# Patient Record
Sex: Male | Born: 1990 | Race: White | Hispanic: No | Marital: Single | Smoking: Former smoker
Health system: Southern US, Community
[De-identification: ages and names within clinical notes are randomized; demographics above are authoritative.]

## PROBLEM LIST (undated history)

## (undated) DIAGNOSIS — K219 Gastro-esophageal reflux disease without esophagitis: Secondary | ICD-10-CM

## (undated) DIAGNOSIS — M26629 Arthralgia of temporomandibular joint, unspecified side: Secondary | ICD-10-CM

## (undated) DIAGNOSIS — T7840XA Allergy, unspecified, initial encounter: Secondary | ICD-10-CM

## (undated) HISTORY — DX: Gastro-esophageal reflux disease without esophagitis: K21.9

## (undated) HISTORY — PX: NO PAST SURGERIES: SHX2092

## (undated) HISTORY — DX: Allergy, unspecified, initial encounter: T78.40XA

## (undated) HISTORY — DX: Arthralgia of temporomandibular joint, unspecified side: M26.629

---

## 2013-10-27 ENCOUNTER — Ambulatory Visit (INDEPENDENT_AMBULATORY_CARE_PROVIDER_SITE_OTHER): Payer: BC Managed Care – PPO | Admitting: Family Medicine

## 2013-10-27 VITALS — BP 110/70 | HR 80 | Temp 98.0°F | Resp 16 | Ht 72.5 in | Wt 163.0 lb

## 2013-10-27 DIAGNOSIS — Z0000 Encounter for general adult medical examination without abnormal findings: Secondary | ICD-10-CM

## 2013-10-27 NOTE — Progress Notes (Signed)
Patient ID: Marc Decker MRN: [id number], DOB: 05/19/1991 23 y.o. Date of Encounter: 10/27/2013, 8:31 PM  Primary Physician: No primary provider on file.  Chief Complaint: Physical (CPE)  HPI: 23 y.o. y/o male with history noted below here for CPE.  Doing well. No issues/complaints.  Review of Systems: Consitutional: No fever, chills, fatigue, night sweats, lymphadenopathy, or weight changes. Eyes: No visual changes, eye redness, or discharge. ENT/Mouth: Ears: No otalgia, tinnitus, hearing loss, discharge. Nose: No congestion, rhinorrhea, sinus pain, or epistaxis. Throat: No sore throat, post nasal drip, or teeth pain. Cardiovascular: No CP, palpitations, diaphoresis, DOE, edema, orthopnea, PND. Respiratory: No cough, hemoptysis, SOB, or wheezing. Gastrointestinal: No anorexia, dysphagia, reflux, pain, nausea, vomiting, hematemesis, diarrhea, constipation, BRBPR, or melena. Genitourinary: No dysuria, frequency, urgency, hematuria, incontinence, nocturia, decreased urinary stream, discharge, impotence, or testicular pain/masses. Musculoskeletal: No decreased ROM, myalgias, stiffness, joint swelling, or weakness. Skin: No rash, erythema, lesion changes, pain, warmth, jaundice, or pruritis. Neurological: No headache, dizziness, syncope, seizures, tremors, memory loss, coordination problems, or paresthesias. Psychological: No anxiety, depression, hallucinations, SI/HI. Endocrine: No fatigue, polydipsia, polyphagia, polyuria, or known diabetes. All other systems were reviewed and are otherwise negative.  No past medical history on file.   No past surgical history on file.  Home Meds:  Prior to Admission medications   Not on File    Allergies:  Allergies  Allergen Reactions  . Zithromax [Azithromycin]     History   Social History  . Marital Status: Single    Spouse Name: N/A    Number of Children: N/A  . Years of Education: N/A   Occupational History  . Not on file.    Social History Main Topics  . Smoking status: Light Tobacco Smoker  . Smokeless tobacco: Not on file  . Alcohol Use: Not on file  . Drug Use: Not on file  . Sexual Activity: Not on file   Other Topics Concern  . Not on file   Social History Narrative  . No narrative on file    No family history on file.  Physical Exam: Blood pressure 110/70, pulse 80, temperature 98 F (36.7 C), resp. rate 16, height 6' 0.5" (1.842 m), weight 163 lb (73.936 kg), SpO2 98.00%.  General: Well developed, well nourished, in no acute distress. HEENT: Normocephalic, atraumatic. Conjunctiva pink, sclera non-icteric. Pupils 2 mm constricting to 1 mm, round, regular, and equally reactive to light and accomodation. EOMI. Internal auditory canal clear. TMs with good cone of light and without pathology. Nasal mucosa pink. Nares are without discharge. No sinus tenderness. Oral mucosa pink. Dentition good. Pharynx without exudate.   Neck: Supple. Trachea midline. No thyromegaly. Full ROM. No lymphadenopathy. Lungs: Clear to auscultation bilaterally without wheezes, rales, or rhonchi. Breathing is of normal effort and unlabored. Cardiovascular: RRR with S1 S2. No murmurs, rubs, or gallops appreciated. Distal pulses 2+ symmetrically. No carotid or abdominal bruits Abdomen: Soft, non-tender, non-distended with normoactive bowel sounds. No hepatosplenomegaly or masses. No rebound/guarding. No CVA tenderness. Without hernias.   Genitourinary:  circumcised male. No penile lesions. Testes descended bilaterally, and smooth without tenderness or masses.  Musculoskeletal: Full range of motion and 5/5 strength throughout. Without swelling, atrophy, tenderness, crepitus, or warmth. Extremities without clubbing, cyanosis, or edema. Calves supple. Skin: Warm and moist without erythema, ecchymosis, wounds, or rash. Neuro: A+Ox3. CN II-XII grossly intact. Moves all extremities spontaneously. Full sensation throughout. Normal gait.  DTR 2+ throughout upper and lower extremities. Finger to nose intact. Psych:  Responds  to questions appropriately with a normal affect.     Assessment/Plan:  23 y.o. y/o  male here for CPE which is normal -  Signed, Elvina Sidle, MD 10/27/2013 8:31 PM

## 2013-10-27 NOTE — Patient Instructions (Signed)

## 2014-01-09 ENCOUNTER — Ambulatory Visit (INDEPENDENT_AMBULATORY_CARE_PROVIDER_SITE_OTHER): Payer: BC Managed Care – PPO | Admitting: Internal Medicine

## 2014-01-09 VITALS — BP 110/68 | HR 78 | Temp 98.1°F | Resp 16 | Ht 71.5 in | Wt 157.6 lb

## 2014-01-09 DIAGNOSIS — R059 Cough, unspecified: Secondary | ICD-10-CM

## 2014-01-09 DIAGNOSIS — R05 Cough: Secondary | ICD-10-CM

## 2014-01-09 MED ORDER — HYDROCODONE-HOMATROPINE 5-1.5 MG/5ML PO SYRP: 5.0000 mL | ORAL_SOLUTION | Freq: Four times a day (QID) | ORAL | Status: DC | PRN

## 2014-01-09 NOTE — Progress Notes (Signed)
  This chart was scribed for Ellamae Siaobert Doolittle, MD by Joaquin MusicKristina Sanchez-Matthews, ED Scribe. This patient was seen in room Room/bed 9 and the patient's care was started at 10:58 AM. Subjective:    Patient ID: Marc Decker, male    DOB: 08/29/1990, 23 y.o.   MRN: [id number] Chief Complaint  Patient presents with  . Sore Throat    x 4 days   . Nasal Congestion  . Headache   HPI Marc Decker is a 23 y.o. male who [facility] complaining of ongoing productive cough with associated CP, sore throat, nasal congestion, and HA that began 4 days ago. He states when coughing, he has bilateral CP, near his ribs. Pt states he began taking OTC DayQuil but states this "agitated his stomach". He has been having trouble breathing when lying down lately. Reports having seasonal allergies around this time of year but states his current sx are worse than normal. Denies recent fevers, SOB, and hx of asthma.  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  No current outpatient prescriptions on file.  Review of Systems  Constitutional: Negative for fever and chills.  HENT: Positive for congestion, sinus pressure and sore throat. Negative for trouble swallowing.   Respiratory: Positive for cough. Negative for shortness of breath.   Cardiovascular: Positive for chest pain (when coughing).  Neurological: Positive for headaches.   Objective:   Physical Exam  Nursing note and vitals reviewed. Constitutional: He is oriented to person, place, and time. He appears well-developed and well-nourished. No distress.  HENT:  Head: Normocephalic and atraumatic.  Right Ear: External ear normal.  Left Ear: External ear normal.  Nose: Nose normal.  Mouth/Throat: Oropharynx is clear and moist.  Eyes: Conjunctivae and EOM are normal.  Neck: Neck supple. No thyromegaly present.  Cardiovascular: Normal rate, regular rhythm and normal heart sounds.     No murmur heard. Pulmonary/Chest: Effort normal and breath sounds normal. No respiratory distress. He has no wheezes. He has no rales. He exhibits no tenderness.  Lungs are clear.  Musculoskeletal: Normal range of motion.  Lymphadenopathy:    He has no cervical adenopathy.  Neurological: He is alert and oriented to person, place, and time.  Skin: Skin is warm and dry.  Psychiatric: He has a normal mood and affect. His behavior is normal.   Triage Vitals:BP 110/68  Pulse 78  Temp(Src) 98.1 F (36.7 C) (Oral)  Resp 16  Ht 5' 11.5" (1.816 m)  Wt 157 lb 9.6 oz (71.487 kg)  BMI 21.68 kg/m2  SpO2 97% Assessment & Plan:    I have completed the patient encounter in its entirety as documented by the scribe, with editing by me where necessary. Robert P. Merla Richesoolittle, M.D. Cough  viral infection suspected  Meds ordered this encounter  Medications  . HYDROcodone-homatropine (HYCODAN) 5-1.5 MG/5ML syrup    Sig: Take 5 mLs by mouth every 6 (six) hours as needed for cough.    Dispense:  120 mL    Refill:  0   Followup if not well in weight

## 2014-01-13 ENCOUNTER — Telehealth: Payer: Self-pay

## 2014-01-13 NOTE — Telephone Encounter (Signed)
Pt seen Dr Merla Richesoolittle 01/09/14 and states his symptoms are worse and would like to know if Dr Merla Richesoolittle could call in something else for him. He may be reached at [phone]. Thank you

## 2014-01-14 ENCOUNTER — Ambulatory Visit (INDEPENDENT_AMBULATORY_CARE_PROVIDER_SITE_OTHER): Payer: BC Managed Care – PPO | Admitting: Emergency Medicine

## 2014-01-14 VITALS — BP 104/70 | HR 94 | Temp 98.1°F | Resp 18 | Ht 71.0 in | Wt 155.6 lb

## 2014-01-14 DIAGNOSIS — J018 Other acute sinusitis: Secondary | ICD-10-CM

## 2014-01-14 DIAGNOSIS — J209 Acute bronchitis, unspecified: Secondary | ICD-10-CM

## 2014-01-14 MED ORDER — AMOXICILLIN-POT CLAVULANATE 875-125 MG PO TABS: 1.0000 | ORAL_TABLET | Freq: Two times a day (BID) | ORAL | Status: DC

## 2014-01-14 MED ORDER — PROMETHAZINE-CODEINE 6.25-10 MG/5ML PO SYRP: 5.0000 mL | ORAL_SOLUTION | Freq: Four times a day (QID) | ORAL | Status: DC | PRN

## 2014-01-14 MED ORDER — PSEUDOEPHEDRINE-GUAIFENESIN ER 60-600 MG PO TB12: 1.0000 | ORAL_TABLET | Freq: Two times a day (BID) | ORAL | Status: DC

## 2014-01-14 NOTE — Patient Instructions (Signed)
Sinusitis Sinusitis is redness, soreness, and swelling (inflammation) of the paranasal sinuses. Paranasal sinuses are air pockets within the bones of your face (beneath the eyes, the middle of the forehead, or above the eyes). In healthy paranasal sinuses, mucus is able to drain out, and air is able to circulate through them by way of your nose. However, when your paranasal sinuses are inflamed, mucus and air can become trapped. This can allow bacteria and other germs to grow and cause infection. Sinusitis can develop quickly and last only a short time (acute) or continue over a long period (chronic). Sinusitis that lasts for more than 12 weeks is considered chronic.  CAUSES  Causes of sinusitis include:  Allergies.  Structural abnormalities, such as displacement of the cartilage that separates your nostrils (deviated septum), which can decrease the air flow through your nose and sinuses and affect sinus drainage.  Functional abnormalities, such as when the small hairs (cilia) that line your sinuses and help remove mucus do not work properly or are not present. SYMPTOMS  Symptoms of acute and chronic sinusitis are the same. The primary symptoms are pain and pressure around the affected sinuses. Other symptoms include:  Upper toothache.  Earache.  Headache.  Bad breath.  Decreased sense of smell and taste.  A cough, which worsens when you are lying flat.  Fatigue.  Fever.  Thick drainage from your nose, which often is green and may contain pus (purulent).  Swelling and warmth over the affected sinuses. DIAGNOSIS  Your caregiver will perform a physical exam. During the exam, your caregiver may:  Look in your nose for signs of abnormal growths in your nostrils (nasal polyps).  Tap over the affected sinus to check for signs of infection.  View the inside of your sinuses (endoscopy) with a special imaging device with a light attached (endoscope), which is inserted into your  sinuses. If your caregiver suspects that you have chronic sinusitis, one or more of the following tests may be recommended:  Allergy tests.  Nasal culture--A sample of mucus is taken from your nose and sent to a lab and screened for bacteria.  Nasal cytology--A sample of mucus is taken from your nose and examined by your caregiver to determine if your sinusitis is related to an allergy. TREATMENT  Most cases of acute sinusitis are related to a viral infection and will resolve on their own within 10 days. Sometimes medicines are prescribed to help relieve symptoms (pain medicine, decongestants, nasal steroid sprays, or saline sprays).  However, for sinusitis related to a bacterial infection, your caregiver will prescribe antibiotic medicines. These are medicines that will help kill the bacteria causing the infection.  Rarely, sinusitis is caused by a fungal infection. In theses cases, your caregiver will prescribe antifungal medicine. For some cases of chronic sinusitis, surgery is needed. Generally, these are cases in which sinusitis recurs more than 3 times per year, despite other treatments. HOME CARE INSTRUCTIONS   Drink plenty of water. Water helps thin the mucus so your sinuses can drain more easily.  Use a humidifier.  Inhale steam 3 to 4 times a day (for example, sit in the bathroom with the shower running).  Apply a warm, moist washcloth to your face 3 to 4 times a day, or as directed by your caregiver.  Use saline nasal sprays to help moisten and clean your sinuses.  Take over-the-counter or prescription medicines for pain, discomfort, or fever only as directed by your caregiver. SEEK IMMEDIATE MEDICAL CARE IF:    You have increasing pain or severe headaches.  You have nausea, vomiting, or drowsiness.  You have swelling around your face.  You have vision problems.  You have a stiff neck.  You have difficulty breathing. MAKE SURE YOU:   Understand these  instructions.  Will watch your condition.  Will get help right away if you are not doing well or get worse. Document Released: 07/17/2005 Document Revised: 10/09/2011 Document Reviewed: 08/01/2011 ExitCare Patient Information 2015 [employer]. This information is not intended to replace advice given to you by your health care provider. Make sure you discuss any questions you have with your health care provider.  

## 2014-01-14 NOTE — Telephone Encounter (Signed)
Cough  viral infection suspected  Meds ordered this encounter   Medications   .  HYDROcodone-homatropine (HYCODAN) 5-1.5 MG/5ML syrup     Sig: Take 5 mLs by mouth every 6 (six) hours as needed for cough.     Dispense: 120 mL     Refill: 0   Followup if not well/  Called patient, he is here now waiting to be seen.

## 2014-01-14 NOTE — Addendum Note (Signed)
Addended by: Carmelina DaneANDERSON, JEFFERY S on: 01/14/2014 05:35 PM   Modules accepted: Orders

## 2014-01-14 NOTE — Telephone Encounter (Signed)
Call for sxtoms

## 2014-01-14 NOTE — Progress Notes (Signed)
[facility] [address], [place] [address] [phone]  Date:  01/14/2014   Name:  Marc HauGriffin D Russman   DOB:  05/30/1991   MRN:  [id number]  PCP:  No PCP Per Patient    Chief Complaint: Shortness of Breath and Cough   History of Present Illness:  Marc Decker is a 23 y.o. very pleasant male patient who presents with the following:  Ill for over a week with purulent nasal drainage and a cough productive purulent sputum. No wheezing.  Moderate post nasal drainage.  Fatigued.  No fever or chills.  Has an increasing shortness of breath.  No nausea or vomiting.  No stool change or rash. Was taking dayquil but it did not agree with him. Denies other complaint or health concern today.   There are no active problems to display for this patient.   History reviewed. No pertinent past medical history.  History reviewed. No pertinent past surgical history.  History  Substance Use Topics  . Smoking status: Light Tobacco Smoker  . Smokeless tobacco: Not on file  . Alcohol Use: Not on file    History reviewed. No pertinent family history.  Allergies  Allergen Reactions  . Zithromax [Azithromycin]     Medication list has been reviewed and updated.  Current Outpatient Prescriptions on File Prior to Visit  Medication Sig Dispense Refill  . HYDROcodone-homatropine (HYCODAN) 5-1.5 MG/5ML syrup Take 5 mLs by mouth every 6 (six) hours as needed for cough.  120 mL  0   No current facility-administered medications on file prior to visit.    Review of Systems:  As per HPI, otherwise negative.    Physical Examination: Filed Vitals:   01/14/14 1523  BP: 104/70  Pulse: 94  Temp: 98.1 F (36.7 C)  Resp: 18   Filed Vitals:   01/14/14 1523  Height: 5\' 11"  (1.803 m)  Weight: 155 lb 9.6 oz (70.58 kg)   Body mass index is 21.71 kg/(m^2). Ideal Body Weight: Weight in (lb) to have BMI = 25: 178.9  GEN: WDWN, NAD, Non-toxic, A & O x 3 HEENT:  Atraumatic, Normocephalic. Neck supple. No masses, No LAD. Ears and Nose: No external deformity. CV: RRR, No M/G/R. No JVD. No thrill. No extra heart sounds. PULM: CTA B, no wheezes, crackles, rhonchi. No retractions. No resp. distress. No accessory muscle use. ABD: S, NT, ND, +BS. No rebound. No HSM. EXTR: No c/c/e NEURO Normal gait.  PSYCH: Normally interactive. Conversant. Not depressed or anxious appearing.  Calm demeanor.    Assessment and Plan: Sinusitis  Bronchitis augmentin mucinex  Signed,  Phillips OdorJeffery Heily Carlucci, MD

## 2014-01-21 ENCOUNTER — Telehealth: Payer: Self-pay

## 2014-01-21 NOTE — Telephone Encounter (Signed)
Given that he has completed all but the last 3 doses, and his symptoms are resolved, I recommend that he stop the antibiotic and not replace it.  If his symptoms recur, he should contact us.  Augmentin added to his allergy list as an intolerance.

## 2014-01-21 NOTE — Telephone Encounter (Signed)
Pt has been having nausea, some vomiting and diarrhea since starting the Augmentin.  Pt took medication today and has 3 doses left. He states his sinus infection has cleared up well. He has been taking Musinex.   Should he be changed to a different abx?

## 2014-01-21 NOTE — Telephone Encounter (Signed)
PATIENT WAS SEEN FOR SINUS INFECTION AND BRONCHITIS, HE STATES THE MEDICATION PRESCRIBED CAUSES SEVERE ABDOMINAL PAIN AND DIARRHEA. HE SAYS HE CALLED THE PHARMACIST TO SEE IF HE COULD TAKE IMODIUM AND THEY TOLD HIM THAT WAS NOT A GOOD IDEA. PATIENT WANTS TO KNOW IF HE CAN DISCONTINUE THE USE OF THE MEDICATION WITH HIM ONLY HAVE TWO PILLS LEFT OR IF THERE IS SOMETHING ELSE THAT CAN BE PRESCRIBED. HE HAS TO BE AT WORK TODAY, AND DOES NOT WANT TO FEEL MISERABLE ALL DAY IF HE TAKES THE MEDICATION. PLEASE CONTACT PATIENT AND ADVISE.

## 2014-01-22 NOTE — Telephone Encounter (Signed)
Spoke to pt, he is aware 

## 2014-06-01 ENCOUNTER — Ambulatory Visit (INDEPENDENT_AMBULATORY_CARE_PROVIDER_SITE_OTHER): Payer: BC Managed Care – PPO | Admitting: Emergency Medicine

## 2014-06-01 VITALS — BP 110/70 | HR 74 | Temp 98.9°F | Resp 16 | Ht 71.75 in | Wt 155.0 lb

## 2014-06-01 DIAGNOSIS — J018 Other acute sinusitis: Secondary | ICD-10-CM

## 2014-06-01 DIAGNOSIS — J209 Acute bronchitis, unspecified: Secondary | ICD-10-CM

## 2014-06-01 MED ORDER — AMOXICILLIN-POT CLAVULANATE 875-125 MG PO TABS: 1.0000 | ORAL_TABLET | Freq: Two times a day (BID) | ORAL | Status: DC

## 2014-06-01 MED ORDER — PSEUDOEPHEDRINE-GUAIFENESIN ER 60-600 MG PO TB12: 1.0000 | ORAL_TABLET | Freq: Two times a day (BID) | ORAL | Status: DC

## 2014-06-01 MED ORDER — PROMETHAZINE-CODEINE 6.25-10 MG/5ML PO SYRP: 5.0000 mL | ORAL_SOLUTION | Freq: Four times a day (QID) | ORAL | Status: DC | PRN

## 2014-06-01 NOTE — Progress Notes (Signed)
[facility] [address], [place] [address] [phone]  Date:  06/01/2014   Name:  Marc HauGriffin D Turman   DOB:  09/25/1990   MRN:  [id number]  PCP:  No PCP Per Patient    Chief Complaint: Sore Throat; Generalized Body Aches; and Cough   History of Present Illness:  Marc Decker is a 23 y.o. very pleasant male patient who presents with the following:  Ill since the end of last week.  Has nasal congestion and post nasal drainage.  Purulent in character Has cough productive of purulent sputum.  No wheezing or shortness of breath. Sore throat Malaise and myalgias.  Fatigue No fever or chills History of prior sinusitis No nausea or vomiting.  No diarrhea or rash No improvement with over the counter medications or other home remedies. Denies other complaint or health concern today.   There are no active problems to display for this patient.   History reviewed. No pertinent past medical history.  History reviewed. No pertinent past surgical history.  History  Substance Use Topics  . Smoking status: Light Tobacco Smoker  . Smokeless tobacco: Not on file  . Alcohol Use: Not on file    History reviewed. No pertinent family history.  Allergies  Allergen Reactions  . Augmentin [Amoxicillin-Pot Clavulanate] Diarrhea and Nausea And Vomiting  . Zithromax [Azithromycin]     Medication list has been reviewed and updated.  Current Outpatient Prescriptions on File Prior to Visit  Medication Sig Dispense Refill  . HYDROcodone-homatropine (HYCODAN) 5-1.5 MG/5ML syrup Take 5 mLs by mouth every 6 (six) hours as needed for cough. 120 mL 0  . promethazine-codeine (PHENERGAN WITH CODEINE) 6.25-10 MG/5ML syrup Take 5-10 mLs by mouth every 6 (six) hours as needed. 120 mL 0  . pseudoephedrine-guaifenesin (MUCINEX D) 60-600 MG per tablet Take 1 tablet by mouth every 12 (twelve) hours. 18 tablet 0   No current facility-administered medications on file prior to  visit.    Review of Systems:  As per HPI, otherwise negative.    Physical Examination: Filed Vitals:   06/01/14 1155  BP: 110/70  Pulse: 74  Temp: 98.9 F (37.2 C)  Resp: 16   Filed Vitals:   06/01/14 1155  Height: 5' 11.75" (1.822 m)  Weight: 155 lb (70.308 kg)   Body mass index is 21.18 kg/(m^2). Ideal Body Weight: Weight in (lb) to have BMI = 25: 182.7  GEN: WDWN, moderate distrss, Non-toxic, A & O x 3 HEENT: Atraumatic, Normocephalic. Neck supple. No masses, No LAD. Ears and Nose: No external deformity. CV: RRR, No M/G/R. No JVD. No thrill. No extra heart sounds. PULM: CTA B, no wheezes, crackles, rhonchi. No retractions. No resp. distress. No accessory muscle use. ABD: S, NT, ND, +BS. No rebound. No HSM. EXTR: No c/c/e NEURO Normal gait.  PSYCH: Normally interactive. Conversant. Not depressed or anxious appearing.  Calm demeanor.    Assessment and Plan: Sinusitis Bronchitis augmentin mucinex d Phen c cod  Signed,  Phillips OdorJeffery Jonny Longino, MD

## 2014-06-01 NOTE — Patient Instructions (Signed)

## 2014-07-27 ENCOUNTER — Ambulatory Visit (INDEPENDENT_AMBULATORY_CARE_PROVIDER_SITE_OTHER): Payer: BC Managed Care – PPO | Admitting: Family Medicine

## 2014-07-27 VITALS — BP 124/80 | HR 77 | Temp 98.2°F | Resp 18 | Ht 73.0 in | Wt 161.8 lb

## 2014-07-27 DIAGNOSIS — R059 Cough, unspecified: Secondary | ICD-10-CM

## 2014-07-27 DIAGNOSIS — R6889 Other general symptoms and signs: Secondary | ICD-10-CM

## 2014-07-27 DIAGNOSIS — R05 Cough: Secondary | ICD-10-CM

## 2014-07-27 LAB — POCT INFLUENZA A/B
Influenza A, POC: NEGATIVE
Influenza B, POC: NEGATIVE

## 2014-07-27 MED ORDER — OSELTAMIVIR PHOSPHATE 75 MG PO CAPS: 75.0000 mg | ORAL_CAPSULE | Freq: Two times a day (BID) | ORAL | Status: DC

## 2014-07-27 MED ORDER — HYDROCODONE-HOMATROPINE 5-1.5 MG/5ML PO SYRP: 5.0000 mL | ORAL_SOLUTION | Freq: Four times a day (QID) | ORAL | Status: DC | PRN

## 2014-07-27 NOTE — Progress Notes (Signed)
Chief Complaint:  Chief Complaint  Patient presents with  . Flu-Like Symptoms    x1 day  . Diarrhea  . Generalized Body Aches  . Other    hot and cold spells   . Headache    HPI: Marc Decker is a 23 y.o. male who is here for 1 day hx of flu like sxs, has ahd msk aches, ahs had subjective fevers and chillsm did not get flu cavvine, has tried tylenol , has ahd HA No real sinus tenderenss, + , back pain, and msk and neck pain , PND cough   History reviewed. No pertinent past medical history. History reviewed. No pertinent past surgical history. History   Social History  . Marital Status: Single    Spouse Name: N/A    Number of Children: N/A  . Years of Education: N/A   Social History Main Topics  . Smoking status: Light Tobacco Smoker  . Smokeless tobacco: None  . Alcohol Use: None  . Drug Use: None  . Sexual Activity: None   Other Topics Concern  . None   Social History Narrative   History reviewed. No pertinent family history. Allergies  Allergen Reactions  . Augmentin [Amoxicillin-Pot Clavulanate] Diarrhea and Nausea And Vomiting  . Zithromax [Azithromycin]    Prior to Admission medications   Medication Sig Start Date End Date Taking? Authorizing Provider  amoxicillin-clavulanate (AUGMENTIN) 875-125 MG per tablet Take 1 tablet by mouth 2 (two) times daily. Patient not taking: Reported on 07/27/2014 06/01/14   Carmelina DaneJeffery S Anderson, MD  HYDROcodone-homatropine [facility](HYCODAN) 5-1.5 MG/5ML syrup Take 5 mLs by mouth every 6 (six) hours as needed for cough. Patient not taking: Reported on 07/27/2014 01/09/14   Tonye Pearsonobert P Doolittle, MD  promethazine-codeine [facility](PHENERGAN WITH CODEINE) 6.25-10 MG/5ML syrup Take 5-10 mLs by mouth every 6 (six) hours as needed. Patient not taking: Reported on 07/27/2014 06/01/14   Carmelina DaneJeffery S Anderson, MD  pseudoephedrine-guaifenesin [facility](MUCINEX D) 60-600 MG per tablet Take 1 tablet by mouth every 12 (twelve) hours. Patient not taking: Reported  on 07/27/2014 06/01/14 06/01/15  Carmelina DaneJeffery S Anderson, MD     ROS: The patient denies fevers, chills, night sweats, unintentional weight loss, chest pain, palpitations, wheezing, dyspnea on exertion, nausea, vomiting, abdominal pain, dysuria, hematuria, melena, numbness, weakness, or tingling.   All other systems have been reviewed and were otherwise negative with the exception of those mentioned in the HPI and as above.    PHYSICAL EXAM: Filed Vitals:   07/27/14 0954  BP: 124/80  Pulse: 77  Temp: 98.2 F (36.8 C)  Resp: 18   Filed Vitals:   07/27/14 0954  Height: 6\' 1"  (1.854 m)  Weight: 161 lb 12.8 oz (73.392 kg)   Body mass index is 21.35 kg/(m^2).  General: Alert, no acute distress HEENT:  Normocephalic, atraumatic, oropharynx patent. EOMI, PERRLA Cardiovascular:  Regular rate and rhythm, no rubs murmurs or gallops.  No Carotid bruits, radial pulse intact. No pedal edema.  Respiratory: Clear to auscultation bilaterally.  No wheezes, rales, or rhonchi.  No cyanosis, no use of accessory musculature GI: No organomegaly, abdomen is soft and non-tender, positive bowel sounds.  No masses. Skin: No rashes. Neurologic: Facial musculature symmetric. Psychiatric: Patient is appropriate throughout our interaction. Lymphatic: No cervical lymphadenopathy Musculoskeletal: Gait intact.   LABS: Results for orders placed or performed in visit on 07/27/14  POCT Influenza A/B  Result Value Ref Range   Influenza A, POC Negative    Influenza B,  POC Negative      EKG/XRAY:   [facility]   ASSESSMENT/PLAN: Encounter Diagnoses  Name Primary?  . Cough Yes  . Fever, unspecified fever cause    Sxs treamtemnt, if worse will let me know  Gross sideeffects, risk and benefits, and alternatives of medications d/w patient. Patient is aware that all medications have potential sideeffects and we are unable to predict every sideeffect or drug-drug interaction that  may occur.  LE, THAO PHUONG, DO 07/27/2014 11:12 AM

## 2014-07-27 NOTE — Patient Instructions (Signed)
Delsyn otc for cough and to dry up your mucus drainage Take otc nasacort for inflammation of nose   Influenza Influenza ("the flu") is a viral infection of the respiratory tract. It occurs more often in winter months because people spend more time in close contact with one another. Influenza can make you feel very sick. Influenza easily spreads from person to person (contagious). CAUSES  Influenza is caused by a virus that infects the respiratory tract. You can catch the virus by breathing in droplets from an infected person's cough or sneeze. You can also catch the virus by touching something that was recently contaminated with the virus and then touching your mouth, nose, or eyes. RISKS AND COMPLICATIONS You may be at risk for a more severe case of influenza if you smoke cigarettes, have diabetes, have chronic heart disease (such as heart failure) or lung disease (such as asthma), or if you have a weakened immune system. Elderly people and pregnant women are also at risk for more serious infections. The most common problem of influenza is a lung infection (pneumonia). Sometimes, this problem can require emergency medical care and may be life threatening. SIGNS AND SYMPTOMS  Symptoms typically last 4 to 10 days and may include:  Fever.  Chills.  Headache, body aches, and muscle aches.  Sore throat.  Chest discomfort and cough.  Poor appetite.  Weakness or feeling tired.  Dizziness.  Nausea or vomiting. DIAGNOSIS  Diagnosis of influenza is often made based on your history and a physical exam. A nose or throat swab test can be done to confirm the diagnosis. TREATMENT  In mild cases, influenza goes away on its own. Treatment is directed at relieving symptoms. For more severe cases, your health care provider may prescribe antiviral medicines to shorten the sickness. Antibiotic medicines are not effective because the infection is caused by a virus, not by bacteria. HOME CARE  INSTRUCTIONS  Take medicines only as directed by your health care provider.  Use a cool mist humidifier to make breathing easier.  Get plenty of rest until your temperature returns to normal. This usually takes 3 to 4 days.  Drink enough fluid to keep your urine clear or pale yellow.  Cover yourmouth and nosewhen coughing or sneezing,and wash your handswellto prevent thevirusfrom spreading.  Stay homefromwork orschool untilthe fever is gonefor at least 451full day. PREVENTION  An annual influenza vaccination (flu shot) is the best way to avoid getting influenza. An annual flu shot is now routinely recommended for all adults in the [place] SEEK MEDICAL CARE IF:  You experiencechest pain, yourcough worsens,or you producemore mucus.  Youhave nausea,vomiting, ordiarrhea.  Your fever returns or gets worse. SEEK IMMEDIATE MEDICAL CARE IF:  You havetrouble breathing, you become short of breath,or your skin ornails becomebluish.  You have severe painor stiffnessin the neck.  You develop a sudden headache, or pain in the face or ear.  You have nausea or vomiting that you cannot control. MAKE SURE YOU:   Understand these instructions.  Will watch your condition.  Will get help right away if you are not doing well or get worse. Document Released: 07/14/2000 Document Revised: 12/01/2013 Document Reviewed: 10/16/2011 [facility] Patient Information 2015 [place], [place]. This information is not intended to replace advice given to you by your health care provider. Make sure you discuss any questions you have with your health care provider.

## 2014-07-28 ENCOUNTER — Telehealth: Payer: Self-pay

## 2014-07-28 NOTE — Telephone Encounter (Signed)
Patient has questions about the tamiflu as it is $150.  Wants to know if we have coupons.  I.  Patient will try to find one on the internet.  s there something else  [phone]

## 2014-08-20 ENCOUNTER — Ambulatory Visit (INDEPENDENT_AMBULATORY_CARE_PROVIDER_SITE_OTHER): Payer: BLUE CROSS/BLUE SHIELD | Admitting: Family Medicine

## 2014-08-20 VITALS — BP 124/76 | HR 106 | Temp 98.4°F | Resp 17 | Ht 72.0 in | Wt 157.0 lb

## 2014-08-20 DIAGNOSIS — R599 Enlarged lymph nodes, unspecified: Secondary | ICD-10-CM

## 2014-08-20 DIAGNOSIS — M25512 Pain in left shoulder: Secondary | ICD-10-CM

## 2014-08-20 DIAGNOSIS — M79622 Pain in left upper arm: Secondary | ICD-10-CM

## 2014-08-20 DIAGNOSIS — R591 Generalized enlarged lymph nodes: Secondary | ICD-10-CM

## 2014-08-20 LAB — POCT CBC
Granulocyte percent: 61.9 %G (ref 37–80)
HEMATOCRIT: 48.8 % (ref 43.5–53.7)
HEMOGLOBIN: 16 g/dL (ref 14.1–18.1)
LYMPH, POC: 1.9 (ref 0.6–3.4)
MCH: 31.3 pg — AB (ref 27–31.2)
MCHC: 32.8 g/dL (ref 31.8–35.4)
MCV: 95.2 fL (ref 80–97)
MID (CBC): 0.5 (ref 0–0.9)
MPV: 8.1 fL (ref 0–99.8)
PLATELET COUNT, POC: 239 10*3/uL (ref 142–424)
POC GRANULOCYTE: 4 (ref 2–6.9)
POC LYMPH PERCENT: 30.3 %L (ref 10–50)
POC MID %: 7.8 %M (ref 0–12)
RBC: 5.13 M/uL (ref 4.69–6.13)
RDW, POC: 12.9 %
WBC: 6.4 10*3/uL (ref 4.6–10.2)

## 2014-08-20 NOTE — Progress Notes (Signed)
Subjective:  This chart was scribed for Meredith StaggersJeffrey Kalman Nylen, MD by Evon Slackerrance Branch, ED Scribe. This Patient was seen in room 14 and the patients care was started at 9:52 AM   Patient ID: Marc Decker, male    DOB: 08/31/1990, 24 y.o.   MRN: [id number]  Chief Complaint  Patient presents with  . swollen lymph node in arm pit    pain arm pit     HPI HPI Comments: Marc Decker is a 24 y.o. male who presents to the [facility] complaining of left axilla pain onset 3 days prior. Pt states he has left sided neck soreness as well. Pt describes the pain as hot and stabbing. Pt states that the pain slightly radiates down into the left rib area. Denies rash, fever, sore throat, CP, SOB, cough, HA, photophobia, sensitivity to light or unexplained weight loss. Pt states that he recently had the flu 2-3 weeks ago. Pt denies any recent cat scratches or bites. Pt denies any arm scratching. Pt states that he has experienced similar pain once when using a new deodorant but states that there was a rash present and those symptoms resolved on there own after switching back to his normal deodorant.    There are no active problems to display for this patient.  No past medical history on file. No past surgical history on file. Allergies  Allergen Reactions  . Augmentin [Amoxicillin-Pot Clavulanate] Diarrhea and Nausea And Vomiting  . Zithromax [Azithromycin]    Prior to Admission medications   Not on File   History   Social History  . Marital Status: Single    Spouse Name: N/A    Number of Children: N/A  . Years of Education: N/A   Occupational History  . Not on file.   Social History Main Topics  . Smoking status: Never Smoker   . Smokeless tobacco: Not on file  . Alcohol Use: Not on file  . Drug Use: Not on file  . Sexual Activity: Not on file   Other Topics Concern  . Not on file   Social History Narrative    Review of Systems  Constitutional: Negative for  fever and unexpected weight change.  HENT: Negative for sore throat.   Eyes: Negative for photophobia.  Respiratory: Negative for cough and shortness of breath.   Cardiovascular: Negative for chest pain.  Musculoskeletal: Positive for neck pain (soreness).       Left axilla pain.   Skin: Negative for rash.  Neurological: Negative for headaches.     Objective:   BP 124/76 mmHg  Pulse 106  Temp(Src) 98.4 F (36.9 C) (Oral)  Resp 17  Ht 6' (1.829 m)  Wt 157 lb (71.215 kg)  BMI 21.29 kg/m2  SpO2 97%   Physical Exam  Constitutional: He is oriented to person, place, and time. He appears well-developed and well-nourished.  HENT:  Head: Normocephalic and atraumatic.  Right Ear: Tympanic membrane, external ear and ear canal normal.  Left Ear: Tympanic membrane, external ear and ear canal normal.  Nose: No rhinorrhea.  Mouth/Throat: Oropharynx is clear and moist and mucous membranes are normal. No oropharyngeal exudate or posterior oropharyngeal erythema.  No tonsillary hypertrophy.   Eyes: Conjunctivae are normal. Pupils are equal, round, and reactive to light.  Neck: Neck supple.  Cardiovascular: Normal rate, regular rhythm, normal heart sounds and intact distal pulses.   No murmur heard. Pulmonary/Chest: Effort normal and breath sounds normal. He has no wheezes. He  has no rhonchi. He has no rales.  Abdominal: Soft. There is no tenderness.  Lymphadenopathy:       Head (left side): No occipital adenopathy present.    He has cervical adenopathy.       Left cervical: Posterior cervical adenopathy present.    He has no axillary adenopathy.  1 small mobile posterior cervical lymph node on left side, no occipital adenopathy noted, Minimal tenderness of submandibular on left side with no apparent swelling or lymph adenopathy. Tenderness in left axilla without apparent rash or lymph adenopathy.   Neurological: He is alert and oriented to person, place, and time.  Skin: Skin is warm and  dry. No rash noted.  No rash on left axilla  Psychiatric: He has a normal mood and affect. His behavior is normal.  Vitals reviewed.  Results for orders placed or performed in visit on 08/20/14  POCT CBC  Result Value Ref Range   WBC 6.4 4.6 - 10.2 K/uL   Lymph, poc 1.9 0.6 - 3.4   POC LYMPH PERCENT 30.3 10 - 50 %L   MID (cbc) 0.5 0 - 0.9   POC MID % 7.8 0 - 12 %M   POC Granulocyte 4.0 2 - 6.9   Granulocyte percent 61.9 37 - 80 %G   RBC 5.13 4.69 - 6.13 M/uL   Hemoglobin 16.0 14.1 - 18.1 g/dL   HCT, POC 16.1 09.6 - 53.7 %   MCV 95.2 80 - 97 fL   MCH, POC 31.3 (A) 27 - 31.2 pg   MCHC 32.8 31.8 - 35.4 g/dL   RDW, POC 04.5 %   Platelet Count, POC 239 142 - 424 K/uL   MPV 8.1 0 - 99.8 fL     Assessment & Plan:   Marc Decker is a 24 y.o. male Axillary pain, left - Plan: POCT CBC, Epstein-Barr virus VCA antibody panel  Lymphadenopathy of head and neck - Plan: POCT CBC, Epstein-Barr virus VCA antibody panel One small lymph node on L neck, but no appreciable nodes in axilla. Will check EBV with recent illness. Recheck in next week or two if not improved, sooner if worse.    No orders of the defined types were placed in this encounter.   Patient Instructions  You should receive a call or letter about your lab results within the next week to 10 days.  No concerning findings on exam of armpit today. Return to the clinic or go to the nearest emergency room if any of your symptoms worsen or new symptoms occur.

## 2014-08-20 NOTE — Patient Instructions (Signed)
You should receive a call or letter about your lab results within the next week to 10 days.  No concerning findings on exam of armpit today. Return to the clinic or go to the nearest emergency room if any of your symptoms worsen or new symptoms occur.

## 2014-08-24 LAB — EPSTEIN-BARR VIRUS VCA ANTIBODY PANEL
EBV EA IgG: <5 U/mL (ref <9.0)
EBV NA IgG: 83.1 U/mL — ABNORMAL HIGH (ref <18.0)
EBV VCA IgG: 455 U/mL — ABNORMAL HIGH (ref <18.0)

## 2014-08-30 ENCOUNTER — Telehealth: Payer: Self-pay | Admitting: Radiology

## 2014-08-30 NOTE — Telephone Encounter (Signed)
Pt calling about labs. Please review.  

## 2014-08-31 ENCOUNTER — Ambulatory Visit (INDEPENDENT_AMBULATORY_CARE_PROVIDER_SITE_OTHER): Payer: BLUE CROSS/BLUE SHIELD | Admitting: Physician Assistant

## 2014-08-31 VITALS — BP 110/72 | HR 72 | Temp 98.3°F | Resp 16 | Ht 72.0 in | Wt 158.0 lb

## 2014-08-31 DIAGNOSIS — M25512 Pain in left shoulder: Secondary | ICD-10-CM

## 2014-08-31 DIAGNOSIS — R591 Generalized enlarged lymph nodes: Secondary | ICD-10-CM

## 2014-08-31 DIAGNOSIS — R599 Enlarged lymph nodes, unspecified: Secondary | ICD-10-CM

## 2014-08-31 DIAGNOSIS — M79622 Pain in left upper arm: Secondary | ICD-10-CM

## 2014-08-31 DIAGNOSIS — B2790 Infectious mononucleosis, unspecified without complication: Secondary | ICD-10-CM

## 2014-08-31 NOTE — Patient Instructions (Signed)
Infectious Mononucleosis  Infectious mononucleosis (mono) is a common germ (viral) infection in children, teenagers, and young adults.   CAUSES   Mono is an infection caused by the Epstein Barr virus. The virus is spread by close personal contact with someone who has the infection. It can be passed by contact with your saliva through things such as kissing or sharing drinking glasses. Sometimes, the infection can be spread from someone who does not appear sick but still spreads the virus (asymptomatic carrier state).   SYMPTOMS   The most common symptoms of Mono are:  · Sore throat.  · Headache.  · Fatigue.  · Muscle aches.  · Swollen glands.  · Fever.  · Poor appetite.  · Enlarged liver or spleen.  The less common symptoms can include:  · Rash.  · Feeling sick to your stomach (nauseous).  · Abdominal pain.  DIAGNOSIS   Mono is diagnosed by a blood test.   TREATMENT   Treatment of mono is usually at home. There is no medicine that cures this virus. Sometimes hospital treatment is needed in severe cases. Steroid medicine sometimes is needed if the swelling in the throat causes breathing or swallowing problems.   HOME CARE INSTRUCTIONS   · Drink enough fluids to keep your urine clear or pale yellow.  · Eat soft foods. Cool foods like popsicles or ice cream can soothe a sore throat.  · Only take over-the-counter or prescription medicines for pain, discomfort, or fever as directed by your caregiver. Children under 18 years of age should not take aspirin.  · Gargle salt water. This may help relieve your sore throat. Put 1 teaspoon (tsp) of salt in 1 cup of warm water. Sucking on hard candy may also help.  · Rest as needed.  · Start regular activities gradually after the fever is gone. Be sure to rest when tired.  · Avoid strenuous exercise or contact sports until your caregiver says it is okay. The liver and spleen could be seriously injured.  · Avoid sharing drinking glasses or kissing until your caregiver tells you  that you are no longer contagious.  SEEK MEDICAL CARE IF:   · Your fever is not gone after 7 days.  · Your activity level is not back to normal after 2 weeks.  · You have yellow coloring to eyes and skin (jaundice).  SEEK IMMEDIATE MEDICAL CARE IF:   · You have severe pain in the abdomen or shoulder.  · You have trouble swallowing or drooling.  · You have trouble breathing.  · You develop a stiff neck.  · You develop a severe headache.  · You cannot stop throwing up (vomiting).  · You have convulsions.  · You are confused.  · You have trouble with balance.  · You develop signs of body fluid loss (dehydration):  ¨ Weakness.  ¨ Sunken eyes.  ¨ Pale skin.  ¨ Dry mouth.  ¨ Rapid breathing or pulse.  MAKE SURE YOU:   · Understand these instructions.  · Will watch your condition.  · Will get help right away if you are not doing well or get worse.  Document Released: 07/14/2000 Document Revised: 10/09/2011 Document Reviewed: 05/12/2008  ExitCare® Patient Information ©2015 [employer], LLC. This information is not intended to replace advice given to you by your health care provider. Make sure you discuss any questions you have with your health care provider.

## 2014-08-31 NOTE — Progress Notes (Signed)
Subjective:    Patient ID: Marc Decker, male    DOB: 03/29/1991, 24 y.o.   MRN: [id number]  HPI Patient presents for follow up of lymphadenitis that has been present for over 2 weeks. Initially presented with left sided neck pain and axillary pain on 08/20/14. Labs revealed presence of Epstein-Barr virus. Has been taking tylenol for pain and neck pain is reduced and dull in quality. Axillary pain has improved, but still noticeable. Does not use antiperspirant. Endorses HA and fatigue. Denies swelling, fever, or rash. No loss of weight, night sweats, or change in appetite. Is still going to work despite fatigue and works 60 hour weeks. Not involved in any sports or high impact hobbies.    Review of Systems  Constitutional: Positive for fatigue. Negative for fever, chills, diaphoresis, activity change, appetite change and unexpected weight change.  HENT: Negative for sore throat.   Respiratory: Negative for shortness of breath.   Cardiovascular: Negative for chest pain.  Gastrointestinal: Negative for abdominal pain and abdominal distention.  Musculoskeletal: Positive for neck pain (improved). Negative for neck stiffness.  Skin: Negative for rash and wound.  Neurological: Positive for headaches. Negative for light-headedness.  Hematological: Positive for adenopathy.       Objective:   Physical Exam  Constitutional: He is oriented to person, place, and time. He appears well-developed and well-nourished. No distress.  Blood pressure 110/72, pulse 72, temperature 98.3 F (36.8 C), temperature source Oral, resp. rate 16, height 6' (1.829 m), weight 158 lb (71.668 kg), SpO2 98 %.  HENT:  Head: Normocephalic and atraumatic.  Right Ear: External ear normal.  Left Ear: External ear normal.  Eyes: Right eye exhibits no discharge. Left eye exhibits no discharge. No scleral icterus.  Neck: Normal range of motion. Neck supple. No thyromegaly present.  Cardiovascular: Normal rate, regular  rhythm and normal heart sounds.  Exam reveals no gallop and no friction rub.   No murmur heard. Pulmonary/Chest: Effort normal and breath sounds normal. No respiratory distress. He has no wheezes. He has no rales.  Abdominal: Soft. Normal appearance, normal aorta and bowel sounds are normal. He exhibits no distension, no pulsatile liver and no mass. There is no tenderness. There is no rebound and no guarding.  Lymphadenopathy:    He has cervical adenopathy.       Right cervical: Superficial cervical adenopathy present. No deep cervical and no posterior cervical adenopathy present.      Left cervical: Superficial cervical and deep cervical adenopathy present. No posterior cervical adenopathy present.    He has axillary adenopathy.       Right axillary: No pectoral and no lateral adenopathy present.       Left axillary: Lateral adenopathy present. No pectoral adenopathy present. Neurological: He is alert and oriented to person, place, and time.  Skin: Skin is warm, dry and intact. No rash noted. He is not diaphoretic. No erythema. No pallor.       Assessment & Plan:  1. Infectious mononucleosis 3. Lymphadenopathy of head and neck Cervical nodes still present, but small. Plenty of fluid and rest. Continue tylenol for pain. Reading about mono given. Virus/Sx can last up to 8 weeks. Not currently in a relationship so not concerned about having a partner to kiss. No sharing utensils and cups.  2. Axillary pain, left Likely, related to mononucleosis. Possibly hidradenitis developing.  Advised to use warm compresses.   Janan Ridgeishira Sherri Mcarthy PA-C  [facility] 08/31/2014  10:36 AM

## 2015-04-07 ENCOUNTER — Ambulatory Visit (INDEPENDENT_AMBULATORY_CARE_PROVIDER_SITE_OTHER): Payer: BLUE CROSS/BLUE SHIELD | Admitting: Family Medicine

## 2015-04-07 ENCOUNTER — Ambulatory Visit (INDEPENDENT_AMBULATORY_CARE_PROVIDER_SITE_OTHER): Payer: BLUE CROSS/BLUE SHIELD

## 2015-04-07 VITALS — BP 124/80 | HR 76 | Temp 98.6°F | Resp 17 | Ht 71.5 in | Wt 158.0 lb

## 2015-04-07 DIAGNOSIS — S93401A Sprain of unspecified ligament of right ankle, initial encounter: Secondary | ICD-10-CM

## 2015-04-07 DIAGNOSIS — S99911A Unspecified injury of right ankle, initial encounter: Secondary | ICD-10-CM

## 2015-04-07 MED ORDER — IBUPROFEN 800 MG PO TABS: 800.0000 mg | ORAL_TABLET | Freq: Three times a day (TID) | ORAL | Status: DC | PRN

## 2015-04-07 MED ORDER — HYDROCODONE-ACETAMINOPHEN 5-325 MG PO TABS: 1.0000 | ORAL_TABLET | Freq: Four times a day (QID) | ORAL | Status: DC | PRN

## 2015-04-07 NOTE — Progress Notes (Signed)
Subjective:    Patient ID: Marc Decker, male    DOB: 10-16-1990, 24 y.o.   MRN: [id number] This chart was scribed for Norberto Sorenson, MD by Littie Deeds, Medical Scribe. This patient was seen in Room 8 and the patient's care was started at 2:23 PM.   Chief Complaint  Patient presents with  . Ankle Injury    right side     HPI HPI Comments: Marc Decker is a 24 y.o. male who presents to the [facility] complaining of sudden onset right ankle pain after rolling his ankle last night when he was playing softball. Patient states he everted his foot during the injury. He has tried ice and ibuprofen. He has had difficulty ambulating due to the pain. Patient denies history of previous ankle injuries.  Depression screen [facility] 2/9 04/12/2015 04/07/2015  Decreased Interest 0 0  Down, Depressed, Hopeless 0 0  PHQ - 2 Score 0 0    No past medical history on file. No current outpatient prescriptions on file prior to visit.   No current facility-administered medications on file prior to visit.   Allergies  Allergen Reactions  . Augmentin [Amoxicillin-Pot Clavulanate] Diarrhea and Nausea And Vomiting  . Zithromax [Azithromycin]      Review of Systems  Constitutional: Positive for activity change. Negative for fever, appetite change and unexpected weight change.  Cardiovascular: Positive for leg swelling.  Musculoskeletal: Positive for myalgias, joint swelling, arthralgias and gait problem. Negative for back pain.  Skin: Positive for color change. Negative for rash and wound.  Neurological: Negative for weakness and numbness.  Hematological: Negative for adenopathy. Does not bruise/bleed easily.  Psychiatric/Behavioral: Positive for decreased concentration. Negative for dysphoric mood.       Objective:  BP 124/80 mmHg  Pulse 76  Temp(Src) 98.6 F (37 C) (Oral)  Resp 17  Ht 5' 11.5" (1.816 m)  Wt 158 lb (71.668 kg)  BMI 21.73 kg/m2  SpO2 98%  Physical Exam    Constitutional: He is oriented to person, place, and time. He appears well-developed and well-nourished. No distress.  HENT:  Head: Normocephalic and atraumatic.  Mouth/Throat: Oropharynx is clear and moist. No oropharyngeal exudate.  Eyes: Pupils are equal, round, and reactive to light.  Neck: Neck supple.  Cardiovascular: Normal rate.   Pulmonary/Chest: Effort normal.  Musculoskeletal: He exhibits tenderness. He exhibits no edema.  Swelling over lateral and medial malleolus. Achilles intact. No tenderness over Achilles tendon or calcaneus. Point tenderness over medial and lateral malleolus. Tenderness to the anterior, posterior, distal and lateral malleolus. Tenderness over proximal 5th metatarsal head. Some tenderness with squeeze of Tib Fib. Some tenderness over CF ligament.  Neurological: He is alert and oriented to person, place, and time. No cranial nerve deficit.  Skin: Skin is warm and dry. No rash noted.  Psychiatric: He has a normal mood and affect. His behavior is normal.  Nursing note and vitals reviewed.   [facility] (PRIMARY) x-ray report read by Dr. Clelia Croft: Right ankle - Increased density oval bony lesion on anterior aspect of the distal tibia, 17 mm x 5 mm. No acute bony abnormality.      Assessment & Plan:   1. Ankle injuries, right, initial encounter   2. Sprain of ankle, right, initial encounter   Placed in gel cast over ace bandage and weightbearing as tolerated with crutches.  Recheck w/ me in 3-5d.  RICE.   Orders Placed This Encounter  Procedures  . DG Ankle Complete  Right    Standing Status: Future     Number of Occurrences: 1     Standing Expiration Date: 04/06/2016    Order Specific Question:  Reason for Exam (SYMPTOM  OR DIAGNOSIS REQUIRED)    Answer:  eversion injury yest, diffuse tenderness on lateral malleolus, surrounding, cf lig, + squeeze of tib/fib, prox 5th mt    Order Specific Question:  Preferred imaging location?    Answer:  External    Meds  ordered this encounter  Medications  . esomeprazole (NEXIUM) 10 MG packet    Sig: Take 10 mg by mouth daily before breakfast.  . ibuprofen (ADVIL,MOTRIN) 800 MG tablet    Sig: Take 1 tablet (800 mg total) by mouth every 8 (eight) hours as needed.    Dispense:  30 tablet    Refill:  0  . HYDROcodone-acetaminophen (NORCO/VICODIN) 5-325 MG per tablet    Sig: Take 1 tablet by mouth every 6 (six) hours as needed for moderate pain.    Dispense:  15 tablet    Refill:  0    I personally performed the services described in this documentation, which was scribed in my presence. The recorded information has been reviewed and considered, and addended by me as needed.  Norberto Sorenson, MD MPH

## 2015-04-07 NOTE — Patient Instructions (Addendum)
RICE:  R: Rest is achieved by limiting weightbearing. Use crutches until you are able to walk with a normal gait.  If you are limping, that means you are walking to early.  As pain improves you can continue to use your crutches while applying gradual amounts of increasing body weight onto your foot I:   Ice or cold water immersion for 15 to 20 minutes every two to three hours for the first 48 hours or until swelling is improved, whichever comes first. C: Compression with an elastic bandage to minimize swelling should be applied early. E: Elevation - The injured ankle should be kept elevated above the level of the heart to further alleviate swelling.  Nonsteroidal antiinflammatory drugs (NSAIDs) can be used; no particular NSAID has been shown to be superior so you can using whatever you have at home - ibuprofen, aleve, motrin, advil, etc.    Exercises including pointing and flexing the foot and doing foot circles should be started early, once acute pain and swelling subside, to maintain range of motion. The intensity of rehabilitation is increased gradually.  Ankle splints or braces can limit extremes of joint motion and allow early weightbearing while protecting against reinjury.    Acute Ankle Sprain with Phase I Rehab An acute ankle sprain is a partial or complete tear in one or more of the ligaments of the ankle due to traumatic injury. The severity of the injury depends on both the number of ligaments sprained and the grade of sprain. There are 3 grades of sprains.   A grade 1 sprain is a mild sprain. There is a slight pull without obvious tearing. There is no loss of strength, and the muscle and ligament are the correct length.  A grade 2 sprain is a moderate sprain. There is tearing of fibers within the substance of the ligament where it connects two bones or two cartilages. The length of the ligament is increased, and there is usually decreased strength.  A grade 3 sprain is a complete  rupture of the ligament and is uncommon. In addition to the grade of sprain, there are three types of ankle sprains.  Lateral ankle sprains: This is a sprain of one or more of the three ligaments on the outer side (lateral) of the ankle. These are the most common sprains. Medial ankle sprains: There is one large triangular ligament of the inner side (medial) of the ankle that is susceptible to injury. Medial ankle sprains are less common. Syndesmosis, "high ankle," sprains: The syndesmosis is the ligament that connects the two bones of the lower leg. Syndesmosis sprains usually only occur with very severe ankle sprains. SYMPTOMS  Pain, tenderness, and swelling in the ankle, starting at the side of injury that may progress to the whole ankle and foot with time.  "Pop" or tearing sensation at the time of injury.  Bruising that may spread to the heel.  Impaired ability to walk soon after injury. CAUSES   Acute ankle sprains are caused by trauma placed on the ankle that temporarily forces or pries the anklebone (talus) out of its normal socket.  Stretching or tearing of the ligaments that normally hold the joint in place (usually due to a twisting injury). RISK INCREASES WITH:  Previous ankle sprain.  Sports in which the foot may land awkwardly (i.e., basketball, volleyball, or soccer) or walking or running on uneven or rough surfaces.  Shoes with inadequate support to prevent sideways motion when stress occurs.  Poor strength and flexibility.  Poor balance skills.  Contact sports. PREVENTION   Warm up and stretch properly before activity.  Maintain physical fitness:  Ankle and leg flexibility, muscle strength, and endurance.  Cardiovascular fitness.  Balance training activities.  Use proper technique and have a coach correct improper technique.  Taping, protective strapping, bracing, or high-top tennis shoes may help prevent injury. Initially, tape is best; however, it  loses most of its support function within 10 to 15 minutes.  Wear proper-fitted protective shoes (High-top shoes with taping or bracing is more effective than either alone).  Provide the ankle with support during sports and practice activities for 12 months following injury. PROGNOSIS   If treated properly, ankle sprains can be expected to recover completely; however, the length of recovery depends on the degree of injury.  A grade 1 sprain usually heals enough in 5 to 7 days to allow modified activity and requires an average of 6 weeks to heal completely.  A grade 2 sprain requires 6 to 10 weeks to heal completely.  A grade 3 sprain requires 12 to 16 weeks to heal.  A syndesmosis sprain often takes more than 3 months to heal. RELATED COMPLICATIONS   Frequent recurrence of symptoms may result in a chronic problem. Appropriately addressing the problem the first time decreases the frequency of recurrence and optimizes healing time. Severity of the initial sprain does not predict the likelihood of later instability.  Injury to other structures (bone, cartilage, or tendon).  A chronically unstable or arthritic ankle joint is a possibility with repeated sprains. TREATMENT Treatment initially involves the use of ice, medication, and compression bandages to help reduce pain and inflammation. Ankle sprains are usually immobilized in a walking cast or boot to allow for healing. Crutches may be recommended to reduce pressure on the injury. After immobilization, strengthening and stretching exercises may be necessary to regain strength and a full range of motion. Surgery is rarely needed to treat ankle sprains. MEDICATION   Nonsteroidal anti-inflammatory medications, such as aspirin and ibuprofen (do not take for the first 3 days after injury or within 7 days before surgery), or other minor pain relievers, such as acetaminophen, are often recommended. Take these as directed by your caregiver. Contact  your caregiver immediately if any bleeding, stomach upset, or signs of an allergic reaction occur from these medications.  Ointments applied to the skin may be helpful.  Pain relievers may be prescribed as necessary by your caregiver. Do not take prescription pain medication for longer than 4 to 7 days. Use only as directed and only as much as you need. HEAT AND COLD  Cold treatment (icing) is used to relieve pain and reduce inflammation for acute and chronic cases. Cold should be applied for 10 to 15 minutes every 2 to 3 hours for inflammation and pain and immediately after any activity that aggravates your symptoms. Use ice packs or an ice massage.  Heat treatment may be used before performing stretching and strengthening activities prescribed by your caregiver. Use a heat pack or a warm soak. SEEK IMMEDIATE MEDICAL CARE IF:   Pain, swelling, or bruising worsens despite treatment.  You experience pain, numbness, discoloration, or coldness in the foot or toes.  New, unexplained symptoms develop (drugs used in treatment may produce side effects.) EXERCISES  PHASE I EXERCISES RANGE OF MOTION (ROM) AND STRETCHING EXERCISES - Ankle Sprain, Acute Phase I, Weeks 1 to 2 These exercises may help you when beginning to restore flexibility in your ankle. You will likely  work on these exercises for the 1 to 2 weeks after your injury. Once your physician, physical therapist, or athletic trainer sees adequate progress, he or she will advance your exercises. While completing these exercises, remember:   Restoring tissue flexibility helps normal motion to return to the joints. This allows healthier, less painful movement and activity.  An effective stretch should be held for at least 30 seconds.  A stretch should never be painful. You should only feel a gentle lengthening or release in the stretched tissue. RANGE OF MOTION - Dorsi/Plantar Flexion  While sitting with your right / left knee straight,  draw the top of your foot upwards by flexing your ankle. Then reverse the motion, pointing your toes downward.  Hold each position for __________ seconds.  After completing your first set of exercises, repeat this exercise with your knee bent. Repeat __________ times. Complete this exercise __________ times per day.  RANGE OF MOTION - Ankle Alphabet  Imagine your right / left big toe is a pen.  Keeping your hip and knee still, write out the entire alphabet with your "pen." Make the letters as large as you can without increasing any discomfort. Repeat __________ times. Complete this exercise __________ times per day.  STRENGTHENING EXERCISES - Ankle Sprain, Acute -Phase I, Weeks 1 to 2 These exercises may help you when beginning to restore strength in your ankle. You will likely work on these exercises for 1 to 2 weeks after your injury. Once your physician, physical therapist, or athletic trainer sees adequate progress, he or she will advance your exercises. While completing these exercises, remember:   Muscles can gain both the endurance and the strength needed for everyday activities through controlled exercises.  Complete these exercises as instructed by your physician, physical therapist, or athletic trainer. Progress the resistance and repetitions only as guided.  You may experience muscle soreness or fatigue, but the pain or discomfort you are trying to eliminate should never worsen during these exercises. If this pain does worsen, stop and make certain you are following the directions exactly. If the pain is still present after adjustments, discontinue the exercise until you can discuss the trouble with your clinician. STRENGTH - Dorsiflexors  Secure a rubber exercise band/tubing to a fixed object (i.e., table, pole) and loop the other end around your right / left foot.  Sit on the floor facing the fixed object. The band/tubing should be slightly tense when your foot is  relaxed.  Slowly draw your foot back toward you using your ankle and toes.  Hold this position for __________ seconds. Slowly release the tension in the band and return your foot to the starting position. Repeat __________ times. Complete this exercise __________ times per day.  STRENGTH - Plantar-flexors   Sit with your right / left leg extended. Holding onto both ends of a rubber exercise band/tubing, loop it around the ball of your foot. Keep a slight tension in the band.  Slowly push your toes away from you, pointing them downward.  Hold this position for __________ seconds. Return slowly, controlling the tension in the band/tubing. Repeat __________ times. Complete this exercise __________ times per day.  STRENGTH - Ankle Eversion  Secure one end of a rubber exercise band/tubing to a fixed object (table, pole). Loop the other end around your foot just before your toes.  Place your fists between your knees. This will focus your strengthening at your ankle.  Drawing the band/tubing across your opposite foot, slowly, pull your little  toe out and up. Make sure the band/tubing is positioned to resist the entire motion.  Hold this position for __________ seconds. Have your muscles resist the band/tubing as it slowly pulls your foot back to the starting position.  Repeat __________ times. Complete this exercise __________ times per day.  STRENGTH - Ankle Inversion  Secure one end of a rubber exercise band/tubing to a fixed object (table, pole). Loop the other end around your foot just before your toes.  Place your fists between your knees. This will focus your strengthening at your ankle.  Slowly, pull your big toe up and in, making sure the band/tubing is positioned to resist the entire motion.  Hold this position for __________ seconds.  Have your muscles resist the band/tubing as it slowly pulls your foot back to the starting position. Repeat __________ times. Complete this  exercises __________ times per day.  STRENGTH - Towel Curls  Sit in a chair positioned on a non-carpeted surface.  Place your right / left foot on a towel, keeping your heel on the floor.  Pull the towel toward your heel by only curling your toes. Keep your heel on the floor.  If instructed by your physician, physical therapist, or athletic trainer, add weight to the end of the towel. Repeat __________ times. Complete this exercise __________ times per day. Document Released: 02/15/2005 Document Revised: 12/01/2013 Document Reviewed: 10/29/2008 [facility] Patient Information 2015 [place], [place]. This information is not intended to replace advice given to you by your health care provider. Make sure you discuss any questions you have with your health care provider.

## 2015-04-12 ENCOUNTER — Ambulatory Visit (INDEPENDENT_AMBULATORY_CARE_PROVIDER_SITE_OTHER): Payer: BLUE CROSS/BLUE SHIELD | Admitting: Family Medicine

## 2015-04-12 ENCOUNTER — Ambulatory Visit (INDEPENDENT_AMBULATORY_CARE_PROVIDER_SITE_OTHER): Payer: BLUE CROSS/BLUE SHIELD

## 2015-04-12 VITALS — BP 104/64 | HR 94 | Temp 98.7°F | Resp 16 | Ht 71.5 in | Wt 162.0 lb

## 2015-04-12 DIAGNOSIS — S93401D Sprain of unspecified ligament of right ankle, subsequent encounter: Secondary | ICD-10-CM | POA: Diagnosis not present

## 2015-04-12 DIAGNOSIS — S99921D Unspecified injury of right foot, subsequent encounter: Secondary | ICD-10-CM | POA: Diagnosis not present

## 2015-04-12 MED ORDER — HYDROCODONE-ACETAMINOPHEN 5-325 MG PO TABS: 1.0000 | ORAL_TABLET | Freq: Four times a day (QID) | ORAL | Status: DC | PRN

## 2015-04-12 NOTE — Progress Notes (Addendum)
Subjective:  This chart was scribed for Norberto Sorenson, MD by Andrew Au, ED Scribe. This patient was seen in room 7 and the patient's care was started at 6:09 PM.   Patient ID: Marc Decker, male    DOB: [birth year], 24 y.o.   MRN: [id number]  HPI Chief Complaint  Patient presents with  . Follow-up    Right ankle injury    HPI Comments: Marc Decker is a 24 y.o. male who presents to the [facility] for a follow up. Pt was seen 5 days ago, had a soft ball eversion injury. Placed in an air cast with crutches for lateral ankle sprain, weight bearing as tolerant.  Pt is able to walk somewhat, but is limping significantly. He has been resting and icing right ankle as advised. He went back to today for the first time, but was unable to work his entire shift due to pain. He works in a [employer]. Pt has not seen an orthopedist in the past.  Pt has been having trouble multi tasking and focusing during the day. He reports difficulty concentrating on task at work, which he is hoping to build a career out of.  He has obtained medication by unlawful means but has found that is has helped with his symptoms.   There are no active problems to display for this patient.  No past medical history on file. Prior to Admission medications   Medication Sig Start Date End Date Taking? Authorizing Provider  esomeprazole (NEXIUM) 10 MG packet Take 10 mg by mouth daily before breakfast.   Yes Historical Provider, MD  HYDROcodone-acetaminophen (NORCO/VICODIN) 5-325 MG per tablet Take 1 tablet by mouth every 6 (six) hours as needed for moderate pain. 04/07/15  Yes Sherren Mocha, MD  ibuprofen (ADVIL,MOTRIN) 800 MG tablet Take 1 tablet (800 mg total) by mouth every 8 (eight) hours as needed. 04/07/15  Yes Sherren Mocha, MD   Review of Systems  Constitutional: Positive for activity change. Negative for fever, chills, diaphoresis and appetite change.  Musculoskeletal: Positive for myalgias,  joint swelling, arthralgias and gait problem. Negative for back pain.  Skin: Positive for color change. Negative for rash.  Neurological: Positive for weakness. Negative for numbness.  Hematological: Does not bruise/bleed easily.  Psychiatric/Behavioral: Positive for decreased concentration. Negative for behavioral problems and agitation.   Objective:   Physical Exam  Constitutional: He is oriented to person, place, and time. He appears well-developed and well-nourished. No distress.  HENT:  Head: Normocephalic and atraumatic.  Eyes: Conjunctivae and EOM are normal.  Neck: Neck supple.  Cardiovascular: Normal rate.   Pulmonary/Chest: Effort normal.  Musculoskeletal: Normal range of motion.  Significant effusion over the lateral than great medial malleolus with diffuse ecchymosis mainly below lateral malleolus but some to medial malleolus extending down to DIP joint in toes. More pain mid dorsum foot radiating to CF ligament.   Neurological: He is alert and oriented to person, place, and time.  Skin: Skin is warm and dry.  Psychiatric: He has a normal mood and affect. His behavior is normal.  Nursing note and vitals reviewed.  Filed Vitals:   04/12/15 1803  BP: 104/64  Pulse: 94  Temp: 98.7 F (37.1 C)  TempSrc: Oral  Resp: 16  Height: 5' 11.5" (1.816 m)  Weight: 162 lb (73.483 kg)  SpO2: 98%   [facility] reading (PRIMARY) by Dr. Neva Seat. Right foot-  sesamoid bone beneath distant 1st MT seems to be  in 2 parts but suspect this to be benign. Please comment on oval bone lesion seen on prior ankle X-ray 9/7. Direct clinician [phone] and Dr. Clelia Croft cell phone [phone]   Assessment & Plan:   Concern for high grade syndesmotic sprain will refer to ortho.    1. Foot injury, right, subsequent encounter   2. Severe ankle sprain, right, subsequent encounter     Pt has been was provided a list of local psychiatrist for ADD testing. Orders Placed This Encounter  Procedures  . DG  Foot Complete Right    Standing Status: Future     Number of Occurrences: 1     Standing Expiration Date: 04/11/2016    Order Specific Question:  Reason for Exam (SYMPTOM  OR DIAGNOSIS REQUIRED)    Answer:  pain over dorsum mid foot, severe bruising, ankle sprain 5d ago worsening    Order Specific Question:  Preferred imaging location?    Answer:  External  . Ambulatory referral to Orthopedic Surgery    Referral Priority:  Urgent    Referral Type:  Surgical    Referral Reason:  Specialty Services Required    Requested Specialty:  Orthopedic Surgery    Number of Visits Requested:  1    Meds ordered this encounter  Medications  . HYDROcodone-acetaminophen (NORCO/VICODIN) 5-325 MG per tablet    Sig: Take 1-2 tablets by mouth every 6 (six) hours as needed for moderate pain.    Dispense:  45 tablet    Refill:  0    I personally performed the services described in this documentation, which was scribed in my presence. The recorded information has been reviewed and considered, and addended by me as needed.  Norberto Sorenson, MD MPH

## 2015-04-12 NOTE — Patient Instructions (Addendum)
I would recommend you call [facility] below to get testing for ADD:  [facility] [address] [place], [place] [address]  Phone: [phone]  [facility] [address] [place], [place] [address] Phone: [phone]  [facility]  [address], [place], [place] [address]  [phone]    [facility] [address], [place], [place] [address]  [phone]  [facility] [address] [place], [place] Phone: [phone]   Charlies Silvers, MD, PA [address], [place], [place] [address] Phone: [phone]     I still don't want you walking on your ankle at all - you can touch down to the extent it is not causing you pain.  You can start bearing more weight as long as you are not having severe pain or limping. I'm going to get you to ortho within the next several days and likely they will be able to transition you to a walking boot.  RICE:  R: Rest is achieved by limiting weightbearing. Use crutches until you are able to walk with a normal gait. I:   Ice or cold water immersion for 15 to 20 minutes every two to three hours for the first 48 hours or until swelling is improved, whichever comes first. C: Compression with an elastic bandage to minimize swelling should be applied early. E: Elevation - The injured ankle should be kept elevated above the level of the heart to further alleviate swelling.  Nonsteroidal antiinflammatory drugs (NSAIDs) can be used; no particular NSAID has been shown to be superior so you can using whatever you have at home - ibuprofen, aleve, motrin, advil, etc.    Exercises including pointing and flexing the foot and doing foot circles should be started early, once acute pain and swelling subside, to maintain range of motion. The intensity of rehabilitation is increased  gradually.  Ankle splints or braces can limit extremes of joint motion and allow early weightbearing while protecting against reinjury.   Ankle Sprain An ankle sprain is an injury to the strong, fibrous tissues (ligaments) that hold the bones of your ankle joint together.  CAUSES An ankle sprain is usually caused by a fall or by twisting your ankle. Ankle sprains most commonly occur when you step on the outer edge of your foot, and your ankle turns inward. People who participate in sports are more prone to these types of injuries.  SYMPTOMS   Pain in your ankle. The pain may be present at rest or only when you are trying to stand or walk.  Swelling.  Bruising. Bruising may develop immediately or within 1 to 2 days after your injury.  Difficulty standing or walking, particularly when turning corners or changing directions. DIAGNOSIS  Your caregiver will ask you details about your injury and perform a physical exam of your ankle to determine if you have an ankle sprain. During the physical exam, your caregiver will press on and apply pressure to specific areas of your foot and ankle. Your caregiver will try to move your ankle in certain ways. An X-ray exam may be done to be sure a bone was not broken or a ligament did not separate from one of the bones in your ankle (avulsion fracture).  TREATMENT  Certain types of braces can help stabilize your ankle. Your caregiver can make a recommendation for this. Your caregiver may recommend the use of medicine for pain. If  your sprain is severe, your caregiver may refer you to a surgeon who helps to restore function to parts of your skeletal system (orthopedist) or a physical therapist. HOME CARE INSTRUCTIONS   Apply ice to your injury for 1-2 days or as directed by your caregiver. Applying ice helps to reduce inflammation and pain.  Put ice in a plastic bag.  Place a towel between your skin and the bag.  Leave the ice on for 15-20 minutes at a time,  every 2 hours while you are awake.  Only take over-the-counter or prescription medicines for pain, discomfort, or fever as directed by your caregiver.  Elevate your injured ankle above the level of your heart as much as possible for 2-3 days.  If your caregiver recommends crutches, use them as instructed. Gradually put weight on the affected ankle. Continue to use crutches or a cane until you can walk without feeling pain in your ankle.  If you have a plaster splint, wear the splint as directed by your caregiver. Do not rest it on anything harder than a pillow for the first 24 hours. Do not put weight on it. Do not get it wet. You may take it off to take a shower or bath.  You may have been given an elastic bandage to wear around your ankle to provide support. If the elastic bandage is too tight (you have numbness or tingling in your foot or your foot becomes cold and blue), adjust the bandage to make it comfortable.  If you have an air splint, you may blow more air into it or let air out to make it more comfortable. You may take your splint off at night and before taking a shower or bath. Wiggle your toes in the splint several times per day to decrease swelling. SEEK MEDICAL CARE IF:   You have rapidly increasing bruising or swelling.  Your toes feel extremely cold or you lose feeling in your foot.  Your pain is not relieved with medicine. SEEK IMMEDIATE MEDICAL CARE IF:  Your toes are numb or blue.  You have severe pain that is increasing. MAKE SURE YOU:   Understand these instructions.  Will watch your condition.  Will get help right away if you are not doing well or get worse. Document Released: 07/17/2005 Document Revised: 04/10/2012 Document Reviewed: 07/29/2011 [facility] Patient Information 2015 [place], [place]. This information is not intended to replace advice given to you by your health care provider. Make sure you discuss any questions you have with your health care  provider.

## 2015-06-28 ENCOUNTER — Ambulatory Visit (INDEPENDENT_AMBULATORY_CARE_PROVIDER_SITE_OTHER): Payer: BLUE CROSS/BLUE SHIELD | Admitting: Emergency Medicine

## 2015-06-28 VITALS — BP 109/72 | HR 70 | Temp 97.4°F | Resp 14 | Ht 72.0 in | Wt 166.6 lb

## 2015-06-28 DIAGNOSIS — J0140 Acute pansinusitis, unspecified: Secondary | ICD-10-CM | POA: Diagnosis not present

## 2015-06-28 DIAGNOSIS — J209 Acute bronchitis, unspecified: Secondary | ICD-10-CM | POA: Diagnosis not present

## 2015-06-28 MED ORDER — LEVOFLOXACIN 500 MG PO TABS: 500.0000 mg | ORAL_TABLET | Freq: Every day | ORAL | Status: AC

## 2015-06-28 MED ORDER — HYDROCOD POLST-CPM POLST ER 10-8 MG/5ML PO SUER: 5.0000 mL | Freq: Two times a day (BID) | ORAL | Status: DC

## 2015-06-28 MED ORDER — PSEUDOEPHEDRINE-GUAIFENESIN ER 60-600 MG PO TB12: 1.0000 | ORAL_TABLET | Freq: Two times a day (BID) | ORAL | Status: DC

## 2015-06-28 NOTE — Patient Instructions (Signed)

## 2015-06-28 NOTE — Progress Notes (Signed)
Subjective:  Patient ID: Marc Decker, male    DOB: 1990-09-11  Age: 24 y.o. MRN: [id number]  CC: Nasal Congestion; Sore Throat; Fever; Chills; Nausea; and Immunizations   HPI Marc Decker presents  with several day history of nasal congestion postnasal drainage and sore throat. His nasal discharge purulent color. Denies any fever documented. He felt cold and chilled at times. He has a cough productive of mucopurulent sputum. Has no wheezing or shortness breath. Is no nausea vomiting. He's been ill since Thursday. He's had no improvement with over-the-counter medication  History Marc Decker has no past medical history on file.   He has no past surgical history on file.   His  family history is not on file.  He   reports that he has quit smoking. He has never used smokeless tobacco. He reports that he does not drink alcohol or use illicit drugs.  Outpatient Prescriptions Prior to Visit  Medication Sig Dispense Refill  . esomeprazole (NEXIUM) 10 MG packet Take 10 mg by mouth daily before breakfast.    . HYDROcodone-acetaminophen (NORCO/VICODIN) 5-325 MG per tablet Take 1-2 tablets by mouth every 6 (six) hours as needed for moderate pain. (Patient not taking: Reported on 06/28/2015) 45 tablet 0  . ibuprofen (ADVIL,MOTRIN) 800 MG tablet Take 1 tablet (800 mg total) by mouth every 8 (eight) hours as needed. (Patient not taking: Reported on 06/28/2015) 30 tablet 0   No facility-administered medications prior to visit.    Social History   Social History  . Marital Status: Single    Spouse Name: N/A  . Number of Children: N/A  . Years of Education: N/A   Social History Main Topics  . Smoking status: Former Games developer  . Smokeless tobacco: Never Used  . Alcohol Use: No  . Drug Use: No  . Sexual Activity: Not Asked   Other Topics Concern  . None   Social History Narrative     Review of Systems  Constitutional: Positive for fever and chills. Negative for appetite  change.  HENT: Positive for congestion, postnasal drip, rhinorrhea, sinus pressure and sore throat. Negative for ear pain.   Eyes: Negative for pain and redness.  Respiratory: Negative for cough, shortness of breath and wheezing.   Cardiovascular: Negative for leg swelling.  Gastrointestinal: Negative for nausea, vomiting, abdominal pain, diarrhea, constipation and blood in stool.  Endocrine: Negative for polyuria.  Genitourinary: Negative for dysuria, urgency, frequency and flank pain.  Musculoskeletal: Negative for gait problem.  Skin: Negative for rash.  Neurological: Negative for weakness and headaches.  Psychiatric/Behavioral: Negative for confusion and decreased concentration. The patient is not nervous/anxious.     Objective:  BP 109/72 mmHg  Pulse 70  Temp(Src) 97.4 F (36.3 C) (Oral)  Resp 14  Ht 6' (1.829 m)  Wt 166 lb 9.6 oz (75.569 kg)  BMI 22.59 kg/m2  SpO2 97%  Physical Exam  Constitutional: He is oriented to person, place, and time. He appears well-developed and well-nourished. No distress.  HENT:  Head: Normocephalic and atraumatic.  Right Ear: External ear normal.  Left Ear: External ear normal.  Nose: Nose normal.  Eyes: Conjunctivae and EOM are normal. Pupils are equal, round, and reactive to light. No scleral icterus.  Neck: Normal range of motion. Neck supple. No tracheal deviation present.  Cardiovascular: Normal rate, regular rhythm and normal heart sounds.   Pulmonary/Chest: Effort normal. No respiratory distress. He has no wheezes. He has no rales.  Abdominal: He exhibits no mass. There  is no tenderness. There is no rebound and no guarding.  Musculoskeletal: He exhibits no edema.  Lymphadenopathy:    He has no cervical adenopathy.  Neurological: He is alert and oriented to person, place, and time. Coordination normal.  Skin: Skin is warm and dry. No rash noted.  Psychiatric: He has a normal mood and affect. His behavior is normal.       Assessment & Plan:   [name] was seen today for nasal congestion, sore throat, fever, chills, nausea and immunizations.  Diagnoses and all orders for this visit:  Acute bronchitis, unspecified organism  Acute pansinusitis, recurrence not specified  Other orders -     pseudoephedrine-guaifenesin (MUCINEX D) 60-600 MG 12 hr tablet; Take 1 tablet by mouth every 12 (twelve) hours. -     chlorpheniramine-HYDROcodone (TUSSIONEX PENNKINETIC ER) 10-8 MG/5ML SUER; Take 5 mLs by mouth 2 (two) times daily. -     levofloxacin (LEVAQUIN) 500 MG tablet; Take 1 tablet (500 mg total) by mouth daily.  I am having [name] start on pseudoephedrine-guaifenesin, chlorpheniramine-HYDROcodone, and levofloxacin. I am also having him maintain his esomeprazole, ibuprofen, and HYDROcodone-acetaminophen.  Meds ordered this encounter  Medications  . pseudoephedrine-guaifenesin (MUCINEX D) 60-600 MG 12 hr tablet    Sig: Take 1 tablet by mouth every 12 (twelve) hours.    Dispense:  18 tablet    Refill:  0  . chlorpheniramine-HYDROcodone (TUSSIONEX PENNKINETIC ER) 10-8 MG/5ML SUER    Sig: Take 5 mLs by mouth 2 (two) times daily.    Dispense:  60 mL    Refill:  0  . levofloxacin (LEVAQUIN) 500 MG tablet    Sig: Take 1 tablet (500 mg total) by mouth daily.    Dispense:  10 tablet    Refill:  0    Appropriate red flag conditions were discussed with the patient as well as actions that should be taken.  Patient expressed his understanding.  Follow-up: Return if symptoms worsen or fail to improve.  Carmelina DaneAnderson, Ura Hausen S, MD

## 2015-09-23 ENCOUNTER — Ambulatory Visit (INDEPENDENT_AMBULATORY_CARE_PROVIDER_SITE_OTHER): Payer: BLUE CROSS/BLUE SHIELD

## 2015-09-23 ENCOUNTER — Ambulatory Visit (INDEPENDENT_AMBULATORY_CARE_PROVIDER_SITE_OTHER): Payer: BLUE CROSS/BLUE SHIELD | Admitting: Family Medicine

## 2015-09-23 VITALS — BP 110/70 | HR 81 | Temp 98.2°F | Resp 18 | Ht 72.75 in | Wt 162.0 lb

## 2015-09-23 DIAGNOSIS — J111 Influenza due to unidentified influenza virus with other respiratory manifestations: Secondary | ICD-10-CM | POA: Diagnosis not present

## 2015-09-23 DIAGNOSIS — R05 Cough: Secondary | ICD-10-CM | POA: Diagnosis not present

## 2015-09-23 DIAGNOSIS — R51 Headache: Secondary | ICD-10-CM

## 2015-09-23 DIAGNOSIS — R6889 Other general symptoms and signs: Secondary | ICD-10-CM

## 2015-09-23 DIAGNOSIS — R5383 Other fatigue: Secondary | ICD-10-CM | POA: Diagnosis not present

## 2015-09-23 DIAGNOSIS — R0781 Pleurodynia: Secondary | ICD-10-CM

## 2015-09-23 LAB — POCT INFLUENZA A/B
Influenza A, POC: NEGATIVE
Influenza B, POC: NEGATIVE

## 2015-09-23 LAB — POCT RAPID STREP A (OFFICE): RAPID STREP A SCREEN: NEGATIVE

## 2015-09-23 MED ORDER — PSEUDOEPHEDRINE-GUAIFENESIN ER 60-600 MG PO TB12: 1.0000 | ORAL_TABLET | Freq: Two times a day (BID) | ORAL | Status: DC

## 2015-09-23 MED ORDER — OSELTAMIVIR PHOSPHATE 75 MG PO CAPS: 75.0000 mg | ORAL_CAPSULE | Freq: Two times a day (BID) | ORAL | Status: DC

## 2015-09-23 NOTE — Progress Notes (Signed)
Subjective:    Patient ID: Marc Decker, male    DOB: 1991-05-21, 25 y.o.   MRN: [id number]  09/23/2015  URI   HPI This 25 y.o. male presents for evaluation of cold symptoms onset today. No fever that aware of; no chills/sweats.  +HA.  No ear pain.  +ST R sided mostly; pain with swallowing.  +rhinorrhea; +nasal congestion.  +coughing; +sputum production.  Chest pain with deep breathing.  +SOB.  No v/d.  No asthma hx.  +tobacco smoking.  No medications other than Goody's Powder.  No flu vaccine.   Known TMJ.  Quality work; Health and safety inspector work.    Review of Systems  Constitutional: Positive for chills, diaphoresis and fatigue. Negative for fever.  HENT: Positive for congestion, postnasal drip, rhinorrhea, sore throat, trouble swallowing and voice change. Negative for ear pain and sinus pressure.   Respiratory: Positive for cough and chest tightness. Negative for shortness of breath and wheezing.   Gastrointestinal: Negative for nausea, vomiting and diarrhea.  Neurological: Positive for headaches. Negative for dizziness.    History reviewed. No pertinent past medical history. History reviewed. No pertinent past surgical history. Allergies  Allergen Reactions  . Augmentin [Amoxicillin-Pot Clavulanate] Diarrhea and Nausea And Vomiting  . Zithromax [Azithromycin]     Social History   Social History  . Marital Status: Single    Spouse Name: N/A  . Number of Children: N/A  . Years of Education: N/A   Occupational History  . Not on file.   Social History Main Topics  . Smoking status: Former Games developer  . Smokeless tobacco: Never Used  . Alcohol Use: No  . Drug Use: No  . Sexual Activity: Not on file   Other Topics Concern  . Not on file   Social History Narrative   History reviewed. No pertinent family history.     Objective:    BP 110/70 mmHg  Pulse 81  Temp(Src) 98.2 F (36.8 C) (Oral)  Resp 18  Ht 6' 0.75" (1.848 m)  Wt 162 lb (73.483 kg)  BMI 21.52 kg/m2  SpO2  98% Physical Exam  Constitutional: He is oriented to person, place, and time. He appears well-developed and well-nourished. No distress.  HENT:  Head: Normocephalic and atraumatic.  Right Ear: Tympanic membrane and ear canal normal.  Left Ear: Tympanic membrane and ear canal normal.  Nose: Mucosal edema and rhinorrhea present. Right sinus exhibits no maxillary sinus tenderness and no frontal sinus tenderness. Left sinus exhibits no maxillary sinus tenderness and no frontal sinus tenderness.  Mouth/Throat: Uvula is midline, oropharynx is clear and moist and mucous membranes are normal.  Eyes: Conjunctivae and EOM are normal. Pupils are equal, round, and reactive to light.  Neck: Normal range of motion. Neck supple. Carotid bruit is not present. No thyromegaly present.  Cardiovascular: Normal rate, regular rhythm, normal heart sounds and intact distal pulses.  Exam reveals no gallop and no friction rub.   No murmur heard. Pulmonary/Chest: Effort normal and breath sounds normal. He has no wheezes. He has no rales.  Lymphadenopathy:    He has no cervical adenopathy.  Neurological: He is alert and oriented to person, place, and time. No cranial nerve deficit.  Skin: Skin is warm and dry. No rash noted. He is not diaphoretic.  Psychiatric: He has a normal mood and affect. His behavior is normal.  Nursing note and vitals reviewed.  Results for orders placed or performed in visit on 09/23/15  POCT Influenza A/B  Result Value Ref  Range   Influenza A, POC Negative Negative   Influenza B, POC Negative Negative  POCT rapid strep A  Result Value Ref Range   Rapid Strep A Screen Negative Negative   Dg Chest 2 View  09/23/2015  CLINICAL DATA:  Cough, congestion EXAM: CHEST  2 VIEW COMPARISON:  None. FINDINGS: No active infiltrate or effusion is seen. Mediastinal and hilar contours are unremarkable. The heart is within normal limits in size. No bony abnormality is seen. IMPRESSION: No active  cardiopulmonary disease. Electronically Signed   By: Dwyane Dee M.D.   On: 09/23/2015 10:24      Assessment & Plan:   1. Influenza   2. Flu-like symptoms   3. Pleuritic chest pain    -new. -Rx for Tamiflu, Mucinex D provided. -Supportive care with Tylenol and Motrin. -Recommend Afrin nasal spray bid for five days only. -RTC for acute worsening. -OOW note until 09/27/15.   Orders Placed This Encounter  Procedures  . Culture, Group A Strep    Order Specific Question:  Source    Answer:  throat  . DG Chest 2 View    Standing Status: Future     Number of Occurrences: 1     Standing Expiration Date: 09/22/2016    Order Specific Question:  Reason for Exam (SYMPTOM  OR DIAGNOSIS REQUIRED)    Answer:  pain with deep inspiration    Order Specific Question:  Preferred imaging location?    Answer:  External  . POCT Influenza A/B  . POCT rapid strep A   Meds ordered this encounter  Medications  . oseltamivir (TAMIFLU) 75 MG capsule    Sig: Take 1 capsule (75 mg total) by mouth 2 (two) times daily.    Dispense:  10 capsule    Refill:  0  . pseudoephedrine-guaifenesin (MUCINEX D) 60-600 MG 12 hr tablet    Sig: Take 1 tablet by mouth every 12 (twelve) hours.    Dispense:  18 tablet    Refill:  0    No Follow-up on file.    Antonette Hendricks Paulita Fujita, M.D. [facility] [address] [place], [place]  [address] [phone] phone [phone] fax

## 2015-09-23 NOTE — Patient Instructions (Addendum)
NOTE: Because you received an x-ray today, you will receive an invoice from [facility]. Please contact [facility] at [phone] with questions or concerns regarding your invoice. Our billing staff will not be able to assist you with those questions.  Influenza, Adult Influenza ("the flu") is a viral infection of the respiratory tract. It occurs more often in winter months because people spend more time in close contact with one another. Influenza can make you feel very sick. Influenza easily spreads from person to person (contagious). CAUSES  Influenza is caused by a virus that infects the respiratory tract. You can catch the virus by breathing in droplets from an infected person's cough or sneeze. You can also catch the virus by touching something that was recently contaminated with the virus and then touching your mouth, nose, or eyes. RISKS AND COMPLICATIONS You may be at risk for a more severe case of influenza if you smoke cigarettes, have diabetes, have chronic heart disease (such as heart failure) or lung disease (such as asthma), or if you have a weakened immune system. Elderly people and pregnant women are also at risk for more serious infections. The most common problem of influenza is a lung infection (pneumonia). Sometimes, this problem can require emergency medical care and may be life threatening. SIGNS AND SYMPTOMS  Symptoms typically last 4 to 10 days and may include:  Fever.  Chills.  Headache, body aches, and muscle aches.  Sore throat.  Chest discomfort and cough.  Poor appetite.  Weakness or feeling tired.  Dizziness.  Nausea or vomiting. DIAGNOSIS  Diagnosis of influenza is often made based on your history and a physical exam. A nose or throat swab test can be done to confirm the diagnosis. TREATMENT  In mild cases, influenza goes away on its own. Treatment is directed at relieving symptoms. For more severe cases, your health care provider may  prescribe antiviral medicines to shorten the sickness. Antibiotic medicines are not effective because the infection is caused by a virus, not by bacteria. HOME CARE INSTRUCTIONS  Take medicines only as directed by your health care provider.  Use a cool mist humidifier to make breathing easier.  Get plenty of rest until your temperature returns to normal. This usually takes 3 to 4 days.  Drink enough fluid to keep your urine clear or pale yellow.  Cover yourmouth and nosewhen coughing or sneezing,and wash your handswellto prevent thevirusfrom spreading.  Stay homefromwork orschool untilthe fever is gonefor at least 16full day. PREVENTION  An annual influenza vaccination (flu shot) is the best way to avoid getting influenza. An annual flu shot is now routinely recommended for all adults in the [place] SEEK MEDICAL CARE IF:  You experiencechest pain, yourcough worsens,or you producemore mucus.  Youhave nausea,vomiting, ordiarrhea.  Your fever returns or gets worse. SEEK IMMEDIATE MEDICAL CARE IF:  You havetrouble breathing, you become short of breath,or your skin ornails becomebluish.  You have severe painor stiffnessin the neck.  You develop a sudden headache, or pain in the face or ear.  You have nausea or vomiting that you cannot control. MAKE SURE YOU:   Understand these instructions.  Will watch your condition.  Will get help right away if you are not doing well or get worse.   This information is not intended to replace advice given to you by your health care provider. Make sure you discuss any questions you have with your health care provider.   Document Released: 07/14/2000 Document Revised: 08/07/2014 Document Reviewed: 10/16/2011  Chartered certified accountant Patient Education [employer].

## 2015-09-24 LAB — CULTURE, GROUP A STREP: Organism ID, Bacteria: NORMAL

## 2015-12-23 ENCOUNTER — Ambulatory Visit (INDEPENDENT_AMBULATORY_CARE_PROVIDER_SITE_OTHER): Payer: BLUE CROSS/BLUE SHIELD | Admitting: Physician Assistant

## 2015-12-23 VITALS — BP 122/70 | HR 60 | Temp 97.3°F | Resp 16 | Ht 72.0 in | Wt 165.0 lb

## 2015-12-23 DIAGNOSIS — M26629 Arthralgia of temporomandibular joint, unspecified side: Secondary | ICD-10-CM | POA: Insufficient documentation

## 2015-12-23 DIAGNOSIS — J302 Other seasonal allergic rhinitis: Secondary | ICD-10-CM | POA: Diagnosis not present

## 2015-12-23 DIAGNOSIS — T485X5A Adverse effect of other anti-common-cold drugs, initial encounter: Principal | ICD-10-CM

## 2015-12-23 DIAGNOSIS — J310 Chronic rhinitis: Secondary | ICD-10-CM | POA: Diagnosis not present

## 2015-12-23 DIAGNOSIS — K219 Gastro-esophageal reflux disease without esophagitis: Secondary | ICD-10-CM | POA: Insufficient documentation

## 2015-12-23 MED ORDER — PREDNISONE 20 MG PO TABS: ORAL_TABLET | ORAL | Status: AC

## 2015-12-23 MED ORDER — IPRATROPIUM BROMIDE 0.03 % NA SOLN: 2.0000 | Freq: Two times a day (BID) | NASAL | Status: AC

## 2015-12-23 MED ORDER — FLUTICASONE PROPIONATE 50 MCG/ACT NA SUSP: 2.0000 | Freq: Every day | NASAL | Status: AC

## 2015-12-23 NOTE — Progress Notes (Signed)
Patient ID: Marc Decker, male    DOB: 02/17/1991, 25 y.o.   MRN: [id number]  PCP: No PCP Per Patient  Subjective:   Chief Complaint  Patient presents with  . Cough    chest congestion/ chest fills tight, x 3 months  . Sore Throat  . Sinusitis    yellow mucus/ x 3 months  . Ear Pain    both, ears fill full  . Tick Removal    x 1 wk ago    HPI Presents for evaluation of cough, sinusitis, sore throat, ear fullness/pain. He also reports that he removed a tick from his leg a week ago.  Reports that he has been using OTC nasal decongestant spray for about 3 months. He was seen here 2/25 with similar symptoms and fever, and was presumed to have influenza. As part of his treatment, he was advised to use a nasal spray. He first used the nasal decongestant spray then, and after a few days switched to Flonase, but found it wasn't helpful and stopped. He resumed use of the nasal decongestant spray then.  No fever, chills, nausea, vomiting or diarrhea. No abdominal pain, dizziness, SOB, visual changes.    Review of Systems As above.    Patient Active Problem List   Diagnosis Date Noted  . 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 GERD (gastroesophageal reflux disease) 12/23/2015     Prior to Admission medications   Medication Sig Start Date End Date Taking? Authorizing Provider  esomeprazole (NEXIUM) 10 MG packet Take 10 mg by mouth daily before breakfast. Reported on 09/23/2015   Yes Historical Provider, MD     Allergies  Allergen Reactions  . Augmentin [Amoxicillin-Pot Clavulanate] Diarrhea and Nausea And Vomiting  . Zithromax [Azithromycin]        Objective:  Physical Exam  Constitutional: He is oriented to person, place, and time. He appears well-developed and well-nourished. No distress.  BP 122/70 mmHg  Pulse 60  Temp(Src) 97.3 F (36.3 C) (Oral)  Resp 16  Ht 6' (1.829 m)  Wt 165 lb (74.844 kg)  BMI 22.37 kg/m2  SpO2 99%   HENT:  Head: Normocephalic and  atraumatic.  Right Ear: Hearing, tympanic membrane, external ear and ear canal normal.  Left Ear: Hearing, tympanic membrane, external ear and ear canal normal.  Nose: Mucosal edema present. No rhinorrhea, nose lacerations, sinus tenderness, nasal deformity, septal deviation or nasal septal hematoma. No epistaxis.  No foreign bodies. Right sinus exhibits no maxillary sinus tenderness and no frontal sinus tenderness. Left sinus exhibits no maxillary sinus tenderness and no frontal sinus tenderness.  Mouth/Throat: Uvula is midline, oropharynx is clear and moist and mucous membranes are normal. No uvula swelling. No oropharyngeal exudate.  Eyes: Conjunctivae and EOM are normal. Pupils are equal, round, and reactive to light. Right eye exhibits no discharge. Left eye exhibits no discharge. No scleral icterus.  Neck: Trachea normal, normal range of motion and full passive range of motion without pain. Neck supple. No thyroid mass and no thyromegaly present.  Cardiovascular: Normal rate, regular rhythm and normal heart sounds.   Pulmonary/Chest: Effort normal and breath sounds normal.  Lymphadenopathy:       Head (right side): No submandibular, no tonsillar, no preauricular, no posterior auricular and no occipital adenopathy present.       Head (left side): No submandibular, no tonsillar, no preauricular and no occipital adenopathy present.    He has no cervical adenopathy.       Right: No supraclavicular  adenopathy present.       Left: No supraclavicular adenopathy present.  Neurological: He is alert and oriented to person, place, and time. He has normal strength. No cranial nerve deficit or sensory deficit.  Skin: Skin is warm, dry and intact. No rash noted.  Psychiatric: He has a normal mood and affect. His speech is normal and behavior is normal.           Assessment & Plan:   1. Rhinitis medicamentosa STOP the nasal decongestant spray. Atrovent and oral prednisone taper. Anticipatory  guidance provided. - predniSONE (DELTASONE) 20 MG tablet; Take 3 PO QAM x3days, 2 PO QAM x3days, 1 PO QAM x3days  Dispense: 18 tablet; Refill: 0 - ipratropium (ATROVENT) 0.03 % nasal spray; Place 2 sprays into both nostrils 2 (two) times daily.  Dispense: 30 mL; Refill: 0  2. Other seasonal allergic rhinitis Resume Flonase. Counseled on it's mechanism and the need to use it daily for benefit. Add oral antihistamine and Atrovent PRN. - fluticasone (FLONASE) 50 MCG/ACT nasal spray; Place 2 sprays into both nostrils daily.  Dispense: 16 g; Refill: 12   Fernande Bras, PA-C Physician Assistant-Certified [facility]

## 2015-12-23 NOTE — Patient Instructions (Addendum)
1. STOP the Afrin.  2. Start a daily oral antihistamie, like loratadine (Claritin). This can be used AS NEEDED. 3. Start the prednisone today. Take it with food, and in the mornings.  4. Use the Atrovent (ipratropium bromide) nasal spray until your symptoms are resolved, then stop it. You may resume it AS NEEDED. 5. Start the Flonase again. Use it about 5-10 minutes after one of the doses of Atrovent. CONTINUE to use it as maintenance.    IF you received an x-ray today, you will receive an invoice from [facility]. Please contact [facility] at [phone] with questions or concerns regarding your invoice.   IF you received labwork today, you will receive an invoice from [facility]. Please contact [facility] at [phone] with questions or concerns regarding your invoice.   Our billing staff will not be able to assist you with questions regarding bills from these companies.  You will be contacted with the lab results as soon as they are available. The fastest way to get your results is to activate your My Chart account. Instructions are located on the last page of this paperwork. If you have not heard from us regarding the results in 2 weeks, please contact this office.

## 2021-03-08 ENCOUNTER — Emergency Department: Payer: Worker's Compensation

## 2021-03-08 ENCOUNTER — Emergency Department
Admission: EM | Admit: 2021-03-08 | Discharge: 2021-03-08 | Disposition: A | Discharge status: Home / Self Care | Payer: Worker's Compensation | Attending: Emergency Medicine | Admitting: Emergency Medicine

## 2021-03-08 ENCOUNTER — Encounter: Payer: Self-pay | Admitting: Emergency Medicine

## 2021-03-08 ENCOUNTER — Other Ambulatory Visit: Payer: Self-pay

## 2021-03-08 DIAGNOSIS — M545 Low back pain, unspecified: Secondary | ICD-10-CM | POA: Diagnosis not present

## 2021-03-08 DIAGNOSIS — W010XXA Fall on same level from slipping, tripping and stumbling without subsequent striking against object, initial encounter: Secondary | ICD-10-CM | POA: Insufficient documentation

## 2021-03-08 DIAGNOSIS — G5732 Lesion of lateral popliteal nerve, left lower limb: Secondary | ICD-10-CM

## 2021-03-08 DIAGNOSIS — Y990 Civilian activity done for income or pay: Secondary | ICD-10-CM | POA: Diagnosis not present

## 2021-03-08 DIAGNOSIS — Z87891 Personal history of nicotine dependence: Secondary | ICD-10-CM | POA: Insufficient documentation

## 2021-03-08 DIAGNOSIS — S8992XA Unspecified injury of left lower leg, initial encounter: Secondary | ICD-10-CM | POA: Diagnosis present

## 2021-03-08 DIAGNOSIS — S8412XA Injury of peroneal nerve at lower leg level, left leg, initial encounter: Secondary | ICD-10-CM | POA: Diagnosis not present

## 2021-03-08 MED ORDER — METHYLPREDNISOLONE 4 MG PO TBPK: ORAL_TABLET | ORAL | 0 refills | Status: AC

## 2021-03-08 MED ORDER — MORPHINE SULFATE (PF) 4 MG/ML IV SOLN
4.0000 mg | Freq: Once | INTRAVENOUS | Status: DC
  Filled 2021-03-08: qty 1

## 2021-03-08 MED ORDER — HYDROCODONE-ACETAMINOPHEN 5-325 MG PO TABS
1.0000 | ORAL_TABLET | Freq: Once | ORAL | Status: AC
  Administered 2021-03-08: 1 via ORAL
  Filled 2021-03-08: qty 1

## 2021-03-08 MED ORDER — DEXAMETHASONE SODIUM PHOSPHATE 10 MG/ML IJ SOLN
10.0000 mg | Freq: Once | INTRAMUSCULAR | Status: AC
  Administered 2021-03-08: 10 mg via INTRAVENOUS
  Filled 2021-03-08 (×2): qty 1

## 2021-03-08 NOTE — ED Notes (Signed)
Pt to MRI

## 2021-03-08 NOTE — ED Provider Notes (Signed)
[facility] Emergency Department Provider Note  ____________________________________________   Event Date/Time   First MD Initiated Contact with Patient 03/08/21 1026     (approximate)  I have reviewed the triage vital signs and the nursing notes.   HISTORY  Chief Complaint Fall    HPI Marc Decker is a 30 y.o. male with past medical history as below here with left leg weakness.  The patient states that he fell at work on Friday.  He also had 1 episode where he twisted and almost fell.  He states that he had some mild pain in his lower back screws.  Over the last several days, he has had worsening mild paraspinal lower back pain as well as weakness with dorsiflexion and eversion of his foot.  Reports the pain is 6 out of 10, aching, throbbing.  It is worse with movement and weightbearing.  No alleviating factors.  He has had some subjective decreased sensation along the dorsal aspect of his foot as well.  No other complaints.    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 Authorizing Provider  methylPREDNISolone (MEDROL DOSEPAK) 4 MG TBPK tablet Take as directed on packaging. 03/08/21  Yes Shaune Pollack, MD  esomeprazole (NEXIUM) 10 MG packet Take 10 mg by mouth daily before breakfast. Reported on 09/23/2015    [provider]  fluticasone (FLONASE) 50 MCG/ACT nasal spray Place 2 sprays into both nostrils daily. 12/23/15   Porfirio Oar, PA  ipratropium (ATROVENT) 0.03 % nasal spray Place 2 sprays into both nostrils 2 (two) times daily. 12/23/15   Porfirio Oar, PA  predniSONE  (DELTASONE) 20 MG tablet Take 3 PO QAM x3days, 2 PO QAM x3days, 1 PO QAM x3days 12/23/15   Porfirio Oar, PA    Allergies Augmentin [amoxicillin-pot clavulanate] and Zithromax [azithromycin]  Family History  Problem Relation Age of Onset   Thyroid disease Mother     Social History Social History   Tobacco Use   Smoking status: Former   Smokeless tobacco: Never  Substance Use Topics   Alcohol use: Yes    Alcohol/week: 0.0 standard drinks    Comment: a beer every now and then   Drug use: Yes    Frequency: 7.0 times per week    Types: Marijuana    Review of Systems  Review of Systems  Constitutional:  Negative for chills and fever.  HENT:  Negative for sore throat.   Respiratory:  Negative for shortness of breath.   Cardiovascular:  Negative for chest pain.  Gastrointestinal:  Negative for abdominal pain.  Genitourinary:  Negative for flank pain.  Musculoskeletal:  Negative for neck pain.  Skin:  Negative for rash and wound.  Allergic/Immunologic: Negative for immunocompromised state.  Neurological:  Positive for weakness and numbness.  Hematological:  Does not bruise/bleed easily.    ____________________________________________  PHYSICAL EXAM:      VITAL SIGNS: ED Triage Vitals  Enc Vitals Group     BP 03/08/21 0952 (!) 153/81     Pulse Rate 03/08/21 0952 86     Resp  03/08/21 0952 16     Temp 03/08/21 0952 98.5 F (36.9 C)     Temp Source 03/08/21 0952 Oral     SpO2 03/08/21 0952 100 %     Weight 03/08/21 0940 164 lb 14.5 oz (74.8 kg)     Height 03/08/21 0940 6' (1.829 m)     Head Circumference --      Peak Flow --      Pain Score 03/08/21 0939 4     Pain Loc --      Pain Edu? --      Excl. in GC? --      Physical Exam Vitals and nursing note reviewed.  Constitutional:      General: He is not in acute distress.    Appearance: He is well-developed.  HENT:     Head: Normocephalic and atraumatic.  Eyes:     Conjunctiva/sclera: Conjunctivae normal.   Cardiovascular:     Rate and Rhythm: Normal rate and regular rhythm.     Heart sounds: Normal heart sounds.  Pulmonary:     Effort: Pulmonary effort is normal. No respiratory distress.     Breath sounds: No wheezing.  Abdominal:     General: There is no distension.  Musculoskeletal:     Cervical back: Neck supple.     Comments: Minimal paraspinal lumbar TTP  Skin:    General: Skin is warm.     Capillary Refill: Capillary refill takes less than 2 seconds.     Findings: No rash.  Neurological:     Mental Status: He is alert and oriented to person, place, and time.     Motor: No abnormal muscle tone.     Comments: Weakness with toe dorsiflexion and eversion of the left foot.  Diminished sensation along the lateral aspect of the distal foot.  Quad reflexes appear slightly diminished.  Normal strength and sensation throughout the right lower extremity.      ____________________________________________   LABS (all labs ordered are listed, but only abnormal results are displayed)  Labs Reviewed  RESP PANEL BY RT-PCR (FLU A&B, COVID) [id number]    ____________________________________________  EKG:  ________________________________________  RADIOLOGY All imaging, including plain films, CT scans, and ultrasounds, independently reviewed by me, and interpretations confirmed via formal radiology reads.  ED MD interpretation:   MR Spine Lumbar: No acute abnormality, mild degen disc disease XR Knee Left; Benign sclerotic bone lesion, no acute findings  Official radiology report(s): MR LUMBAR SPINE WO CONTRAST  Result Date: 03/08/2021 CLINICAL DATA:  Acute low back pain after fall at work on Friday. EXAM: MRI LUMBAR SPINE WITHOUT CONTRAST TECHNIQUE: Multiplanar, multisequence MR imaging of the lumbar spine was performed. No intravenous contrast was administered. COMPARISON:  None. FINDINGS: Segmentation: Transitional lumbosacral anatomy with partial sacralization of L5 on the right.  Alignment:  Physiologic. Vertebrae:  No fracture, evidence of discitis, or bone lesion. Conus medullaris and cauda equina: Conus extends to the T12-L1 level. Conus and cauda equina appear normal. Paraspinal and other soft tissues: Negative. Disc levels: T11-T12 to L3-L4: Negative. L4-L5: Mild disc bulging. Mild left neuroforaminal stenosis. No spinal canal or right neuroforaminal stenosis. L5-S1:  Negative disc.  Mild left facet arthropathy.  No stenosis. IMPRESSION: 1. No acute abnormality. 2. Mild degenerative disc disease at L4-L5 with mild left neuroforaminal stenosis. Electronically Signed   By: Obie Dredge M.D.   On: 03/08/2021 11:43   DG Knee Complete 4 Views Left  Result Date: 03/08/2021 CLINICAL DATA:  Knee pain.  EXAM: LEFT KNEE - COMPLETE 4+ VIEW COMPARISON:  None. FINDINGS: There is no acute fracture or dislocation. No joint effusion identified. Nonaggressive appearing sclerotic lesion within the distal right femoral diaphysis with central lucency most likely represents a benign sclerotic bone lesion such as an osteoid osteoma. IMPRESSION: 1. No acute findings. 2. Benign sclerotic bone lesion within the distal right femur has the typical appearance of an osteoid osteoma which may be seen in patients as old is 30 years of age. Electronically Signed   By: Signa Kell M.D.   On: 03/08/2021 14:16    ____________________________________________  PROCEDURES   Procedure(s) performed (including Critical Care):  Procedures  ____________________________________________  INITIAL IMPRESSION / MDM / ASSESSMENT AND PLAN / ED COURSE  As part of my medical decision making, I reviewed the following data within the electronic MEDICAL RECORD NUMBER Nursing notes reviewed and incorporated, Old chart reviewed, Notes from prior ED visits, and [place] Controlled Substance Database       *Marc Decker was evaluated in Emergency Department on 03/08/2021 for the symptoms described in the history of present  illness. He was evaluated in the context of the global COVID-19 pandemic, which necessitated consideration that the patient might be at risk for infection with the SARS-CoV-2 virus that causes COVID-19. Institutional protocols and algorithms that pertain to the evaluation of patients at risk for COVID-19 are in a state of rapid change based on information released by regulatory bodies including the CDC and federal and state organizations. These policies and algorithms were followed during the patient's care in the ED.  Some ED evaluations and interventions may be delayed as a result of limited staffing during the pandemic.*     Medical Decision Making: 30 year old male here with left lower leg weakness and numbness.  Exam is consistent with peroneal nerve palsy, versus else for radiculopathy.  Lumbar MRI obtained given weakness on exam, reviewed, and shows some mild disc disease but this does not necessarily explain the degree of his weakness.  I discussed with Dr. Adriana Simas of neurosurgery who thankfully has evaluated the patient.  Appreciate his recommendations.  He recommends outpatient steroids and follow-up.  Unclear whether this was traumatic or idiopathic.  No other evidence of peripheral neuropathy.  Plain films of the knee showed no fracture or significant bony lesions.  He has a benign-appearing osteoid osteoma.  ____________________________________________  FINAL CLINICAL IMPRESSION(S) / ED DIAGNOSES  Final diagnoses:  Left peroneal nerve palsy     MEDICATIONS GIVEN DURING THIS VISIT:  Medications  morphine 4 MG/ML injection 4 mg (4 mg Intravenous Not Given 03/08/21 1211)  dexamethasone (DECADRON) injection 10 mg (10 mg Intravenous Given 03/08/21 1207)  HYDROcodone-acetaminophen (NORCO/VICODIN) 5-325 MG per tablet 1 tablet (1 tablet Oral Given 03/08/21 1344)     ED Discharge Orders          Ordered    methylPREDNISolone (MEDROL DOSEPAK) 4 MG TBPK tablet        03/08/21 1427              Note:  This document was prepared using Dragon voice recognition software and may include unintentional dictation errors.   Shaune Pollack, MD 03/08/21 [phone]

## 2021-03-08 NOTE — ED Triage Notes (Signed)
First Nurse Note:  Arrives from [facility].  Patient fell at work on Friday.  C/O low back and pelvis pain.  On exam at [facility], left foot drop noted.  Referred to ED for further evaluation.  Patient AAOx3. Skin warm and ry.  Ambulates with steady gait, limping, favors right leg.

## 2021-03-08 NOTE — ED Notes (Signed)
This RN at bedside to administer medications. Pt states that he is not in pain at this point and doesn't think he needs the pain medication at this time. Also, pt states that he does not like the way steroids make him feels so we was hoping to hold off on the Decadron at this time. Dr. Penne Lash, MD made aware. Was instructed by the EDP to hold the medication till his MRI comes back.

## 2021-03-08 NOTE — ED Notes (Signed)
Pt c/o pain in lower left back and above buttock. Denies radiating pain down leg. Pt rates pain on 6 out of 10. Pt had denied pain medication earlier but said after walking to use bathroom he became painful. Pt is not driving himself home- has grandmother for transport. Will notify MD.

## 2021-03-08 NOTE — ED Notes (Signed)
Pt with equal PD pulses. Unable to flex left toes or ankle in upward direction. Sensation equal but feels pins and needles in left foot. Denies pain, leg or back.

## 2021-03-08 NOTE — Consult Note (Signed)
Neurosurgery-New Consultation Evaluation 03/08/2021 Marc Decker [id number]  Identifying Statement: KEMAL Decker is a 30 y.o. male from [place] [address] with left foot numbness and weakness  Physician Requesting Consultation: [facility]  History of Present Illness: Mr Marc Decker is here after falls last week at work where he landed on his buttocks and another where he twisted his knee. He states he did not have any obvious symptoms after but later that night developed numbness and weakness in the foot. He states that it has persisted. He has no right leg symptoms and denies any pain going down left leg. He noticed difficulty with walking which prompted visit today. He had a MRI of lumbar spine in ED that did not reveal any abnormality.neurosurgery is consulted for evaluation  Past Medical History:  Past Medical History:  Diagnosis Date   Allergy    GERD (gastroesophageal reflux disease)    TMJ arthralgia     Social History: Social History   Socioeconomic History   Marital status: Single    Spouse name: n/a   Number of children: 0   Years of education: 12+   Highest education level: Not on file  Occupational History   Occupation: machine shop  Tobacco Use   Smoking status: Former   Smokeless tobacco: Never  Substance and Sexual Activity   Alcohol use: Yes    Alcohol/week: 0.0 standard drinks    Comment: a beer every now and then   Drug use: Yes    Frequency: 7.0 times per week    Types: Marijuana   Sexual activity: Not on file  Other Topics Concern   Not on file  Social History Narrative   Lives with his girlfriend.   Family lives in [place], [place]   Some college at [employer]   Social Determinants of Health   Financial Resource Strain: Not on file  Food Insecurity: Not on file  Transportation Needs: Not on file  Physical Activity: Not on file  Stress: Not on file  Social Connections: Not on file  Intimate Partner Violence: Not on  file     Family History: Family History  Problem Relation Age of Onset   Thyroid disease Mother     Review of Systems:  Review of Systems - General ROS: Negative Psychological ROS: Negative Ophthalmic ROS: Negative ENT ROS: Negative Hematological and Lymphatic ROS: Negative  Endocrine ROS: Negative Respiratory ROS: Negative Cardiovascular ROS: Negative Gastrointestinal ROS: Negative Genito-Urinary ROS: Negative Musculoskeletal ROS: Positive for back pain Neurological ROS: Positive for numbness, weakness Dermatological ROS: Negative  Physical Exam: BP 135/82 (BP Location: Right Arm)   Pulse 89   Temp 97.8 F (36.6 C) (Axillary)   Resp 20   Ht 6' (1.829 m)   Wt 74.8 kg   SpO2 99%   BMI 22.37 kg/m  Body mass index is 22.37 kg/m. Body surface area is 1.95 meters squared. General appearance: Alert, cooperative, in no acute distress Head: Normocephalic, atraumatic Eyes: Normal, EOM intact Ext: No edema in LE bilaterally, warm extremities, + Tinel at left fibular head  Neurologic exam:  Mental status: alertness: alert, affect: normal Speech: fluent and clear Motor:strength 5/5 throughout right leg, 5/5 in left hip flexion, knee flexion/extension, plantar flexion and foot inversion. 2/5 in dorsiflexion, 1/5 in EHL, eversion Sensory: decreased sensation over lateral left leg and dorsum foot Gait: not tested    Imaging: MRI Lumbar Spine: 1. No acute abnormality. 2. Mild degenerative disc disease at L4-L5 with mild left neuroforaminal  stenosis.   Impression/Plan:  Mr Marc Decker has what appears to be a peroneal neuropathy on the left. I presume this is due to his recent trauma. His lumbar spine MRI is unremarkable. We discussed utility of EMG in a couple of weeks if he doesn't improve. At this time, I would recommend PT and a short steroid course. We will set him up to see [place] in clinic in a couple of weeks.    1.  Diagnosis: Peroneal neuropathy   2.  Plan - Follow up  in clinic - PT - steroid taper

## 2022-05-17 IMAGING — DX DG KNEE COMPLETE 4+V*L*
4 series · 4 of 4 positions shown · non-contrast
Comparison: None.

CLINICAL DATA: Knee pain.

EXAM:
LEFT KNEE - COMPLETE 4+ VIEW

[knee ap]
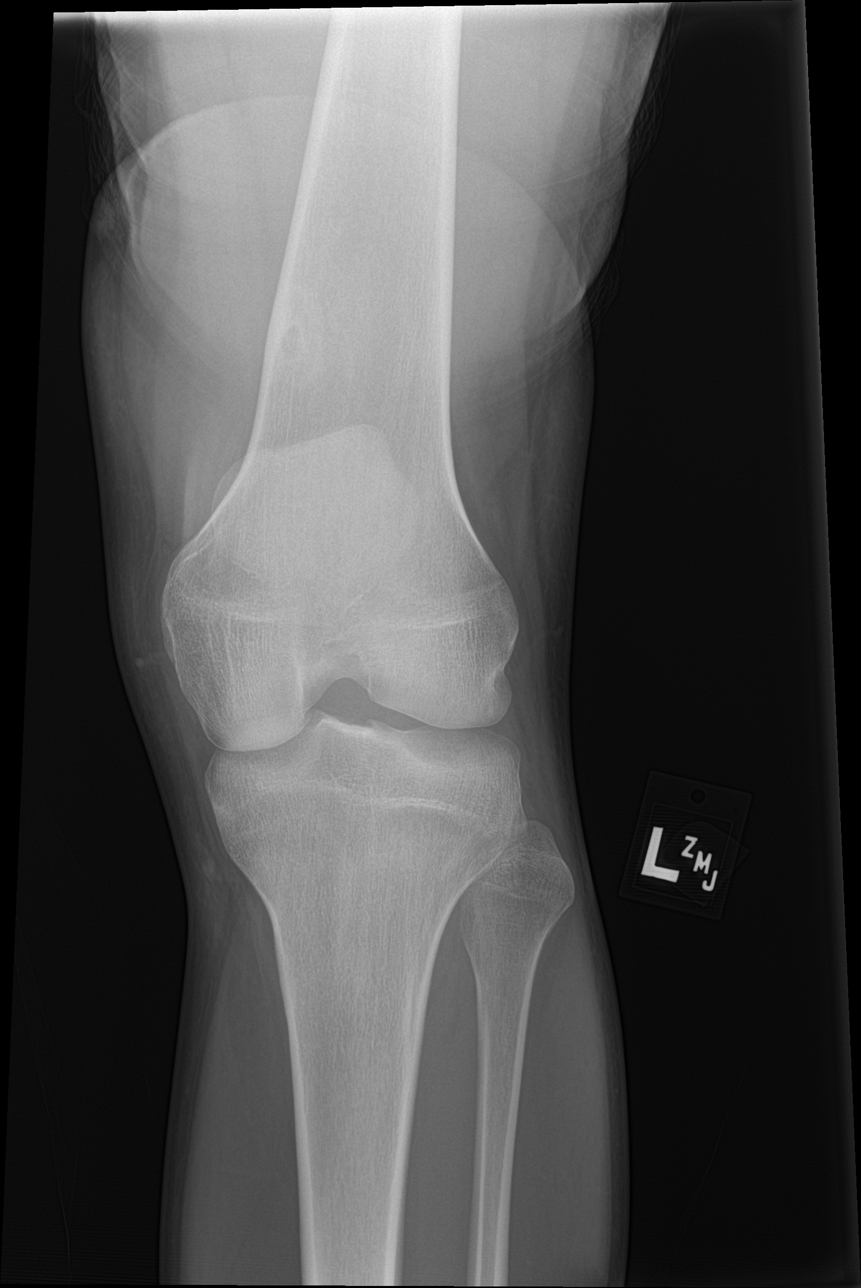

[knee lat]
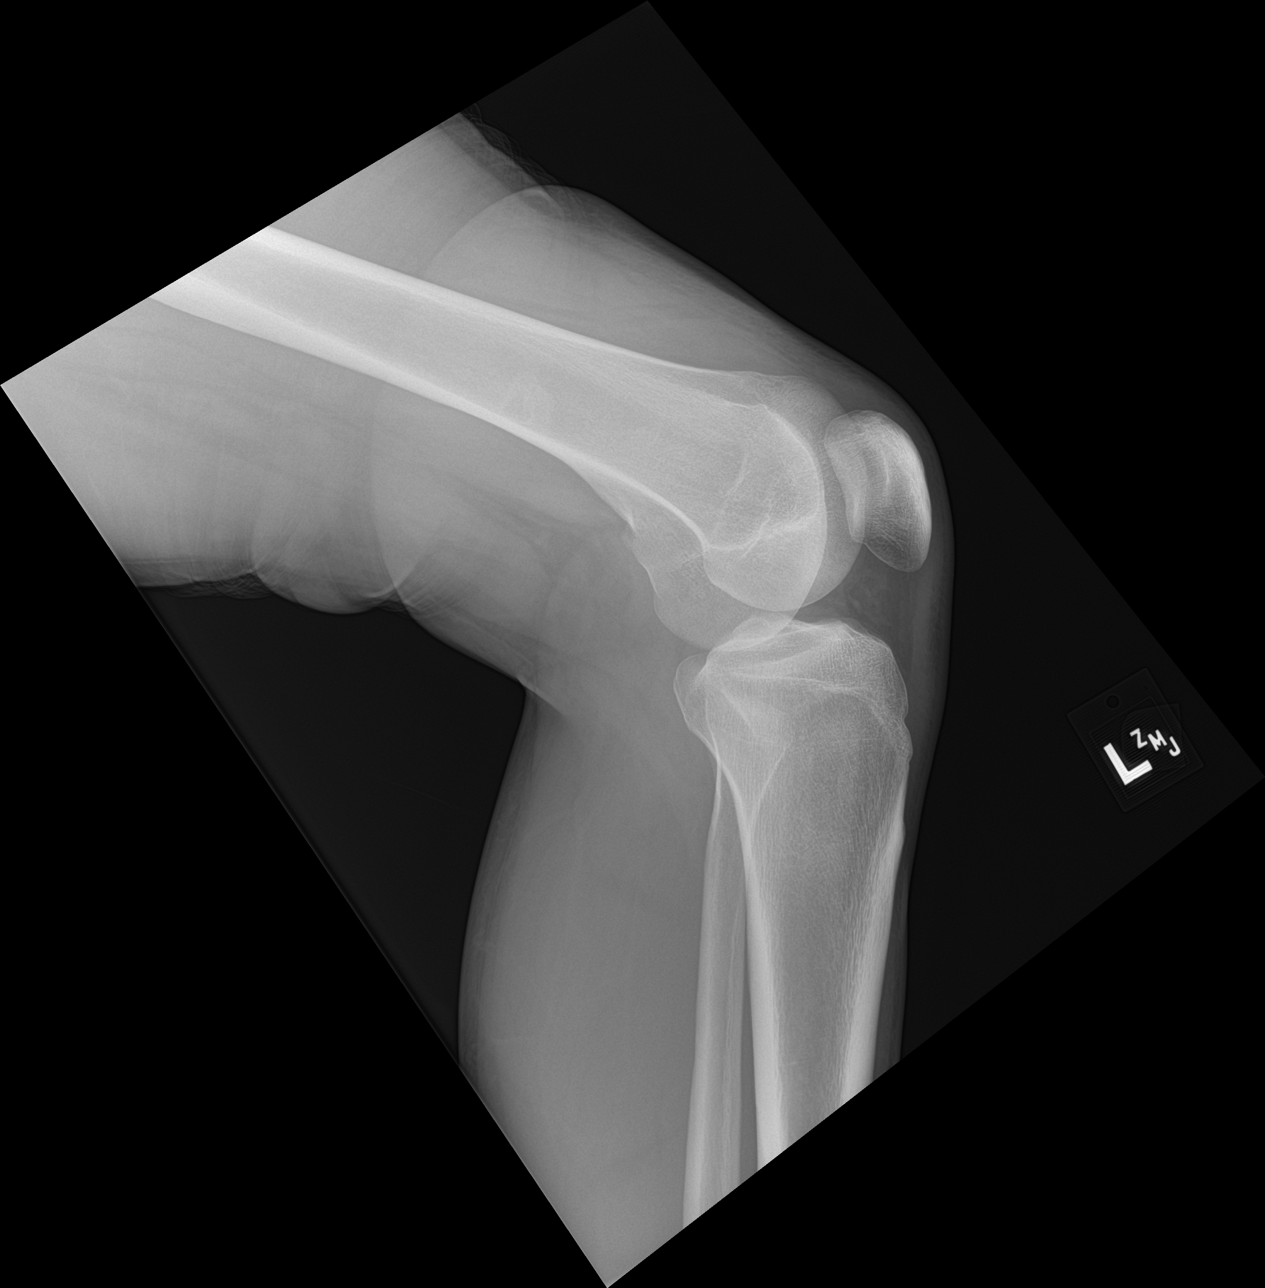

[knee obl (1 of 2)]
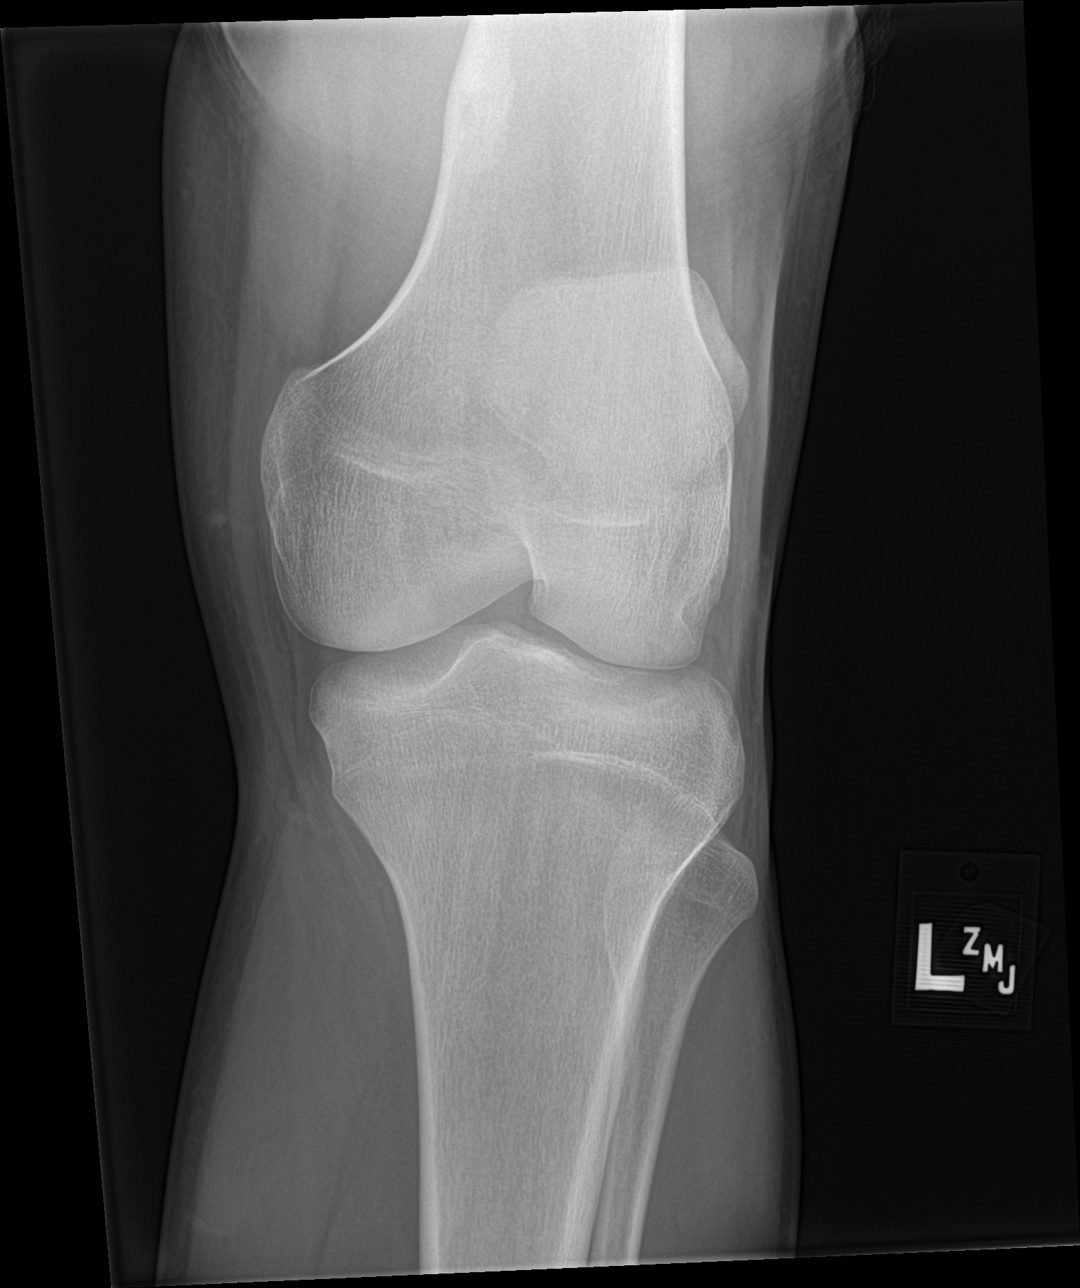

[knee obl (2 of 2)]
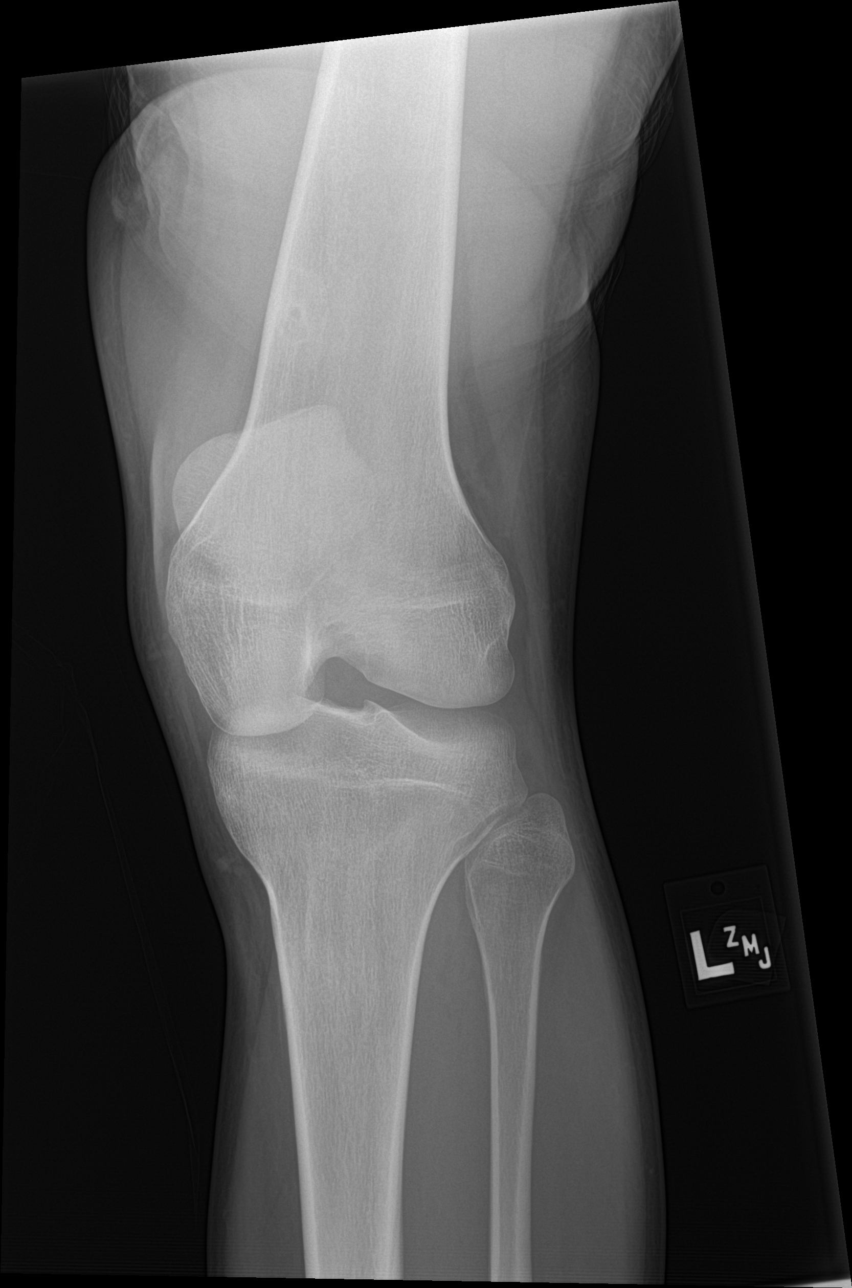

[4 of 4 positions shown; findings below may reference images not displayed]

FINDINGS: There is no acute fracture or dislocation. No joint effusion
identified. Nonaggressive appearing sclerotic lesion within the
distal right femoral diaphysis with central lucency most likely
represents a benign sclerotic bone lesion such as an osteoid
osteoma.
IMPRESSION: 1. No acute findings.
2. Benign sclerotic bone lesion within the distal right femur has
the typical appearance of an osteoid osteoma which may be seen in
patients as old is 35 years of age.

## 2022-05-17 IMAGING — MR MR LUMBAR SPINE W/O CM
5 series · 32 of 48 positions shown · non-contrast
Comparison: None.

CLINICAL DATA: Acute low back pain after fall at work on [REDACTED].

EXAM:
MRI LUMBAR SPINE WITHOUT CONTRAST
TECHNIQUE: Multiplanar, multisequence MR imaging of the lumbar spine was
performed. No intravenous contrast was administered.

[Series 5: T2 · sagittal · 4.0mm · 0.81mm/px · 6 of 17 slices shown (1 of 2)]
[im 1/17]
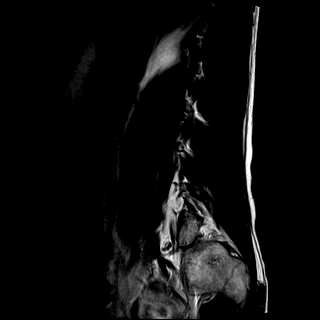
[im 4/17]
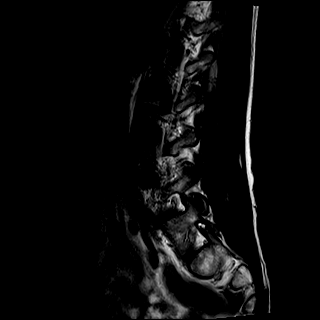
[im 7/17]
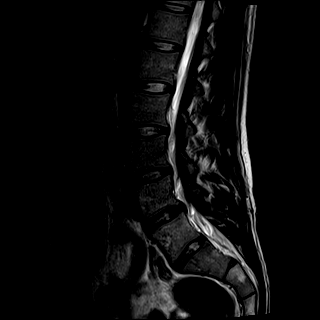
[im 10/17]
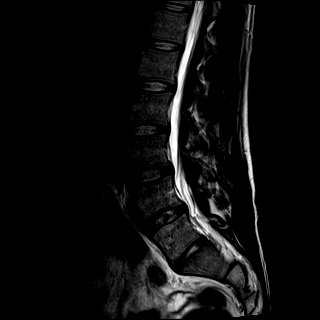
[im 13/17]
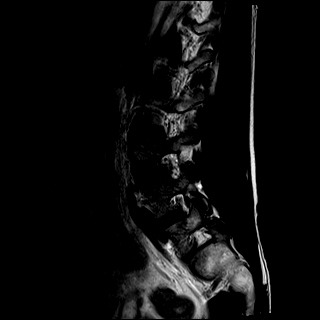
[im 17/17]
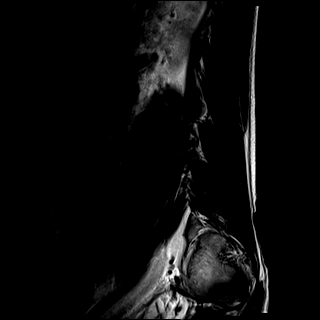

[Series 6: T1 · sagittal · 4.0mm · 0.81mm/px · 6 of 17 slices shown (1 of 2)]
[im 1/17]
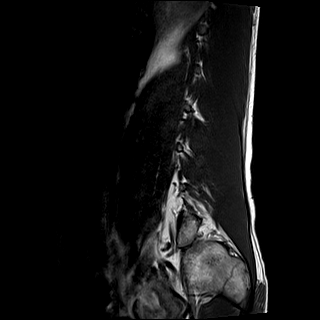
[im 4/17]
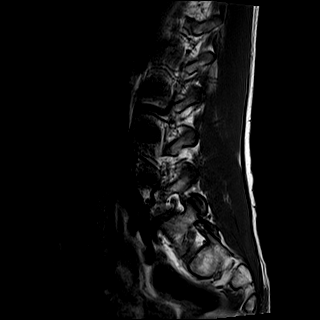
[im 7/17]
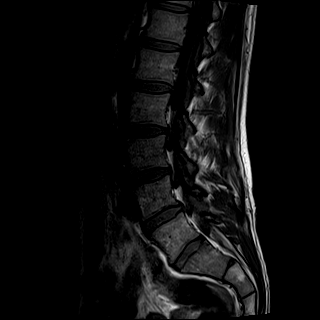
[im 10/17]
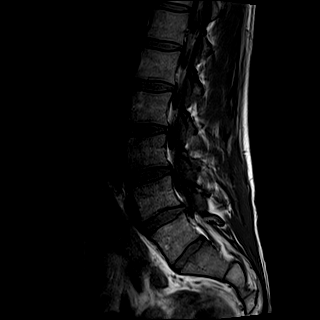
[im 13/17]
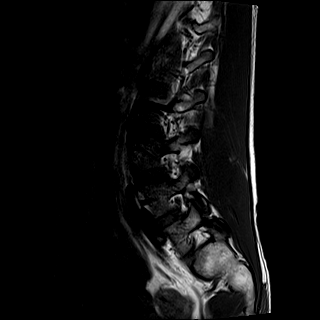
[im 17/17]
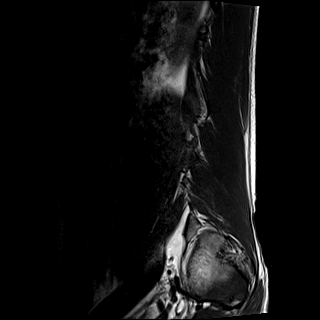

[Series 7: STIR · sagittal · 4.0mm · 0.41mm/px · 2 of 17 slices shown]
[im 1/17]
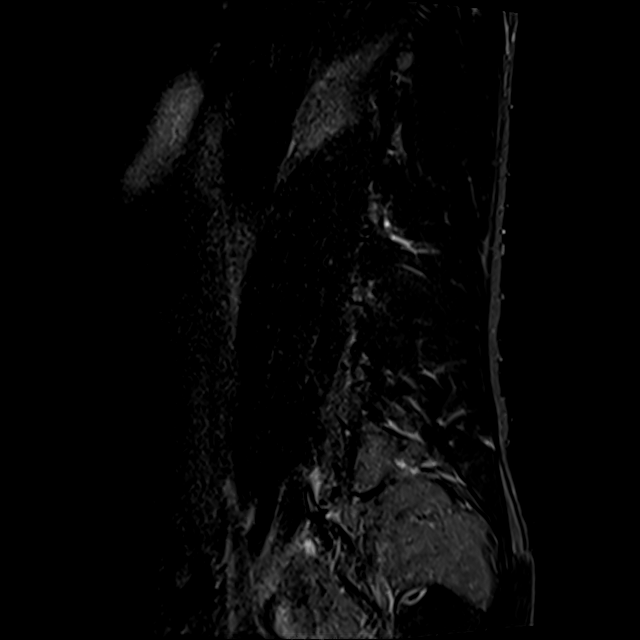
[im 4/17]
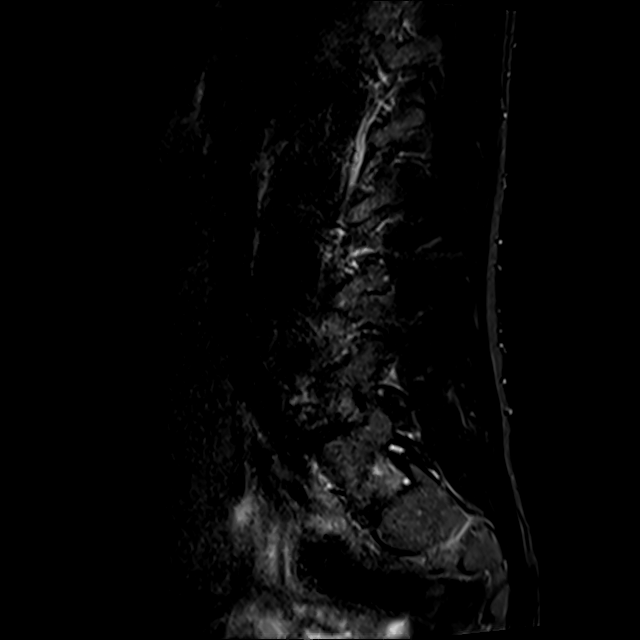

[Series 8: T2 · axial · 4.0mm · 0.78mm/px · z∈[-243,+9]mm · 9 of 43 slices shown (2 of 2)]
[im 1/43]
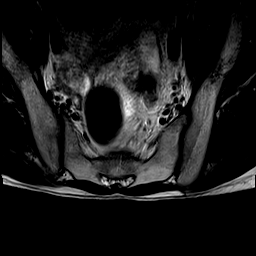
[im 7/43]
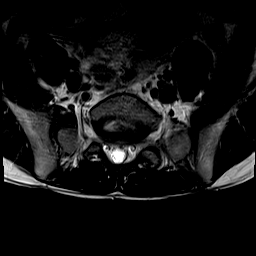
[im 13/43]
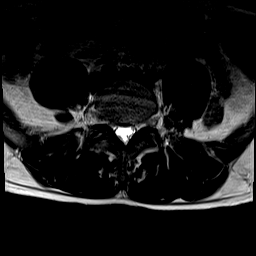
[im 19/43]
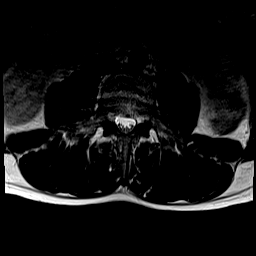
[im 22/43]
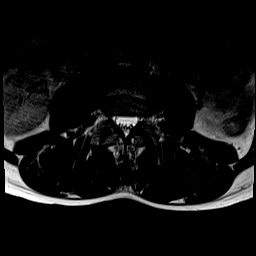
[im 25/43]
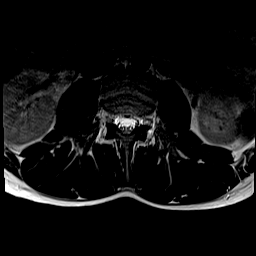
[im 31/43]
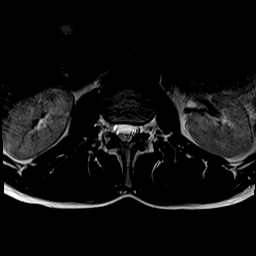
[im 37/43]
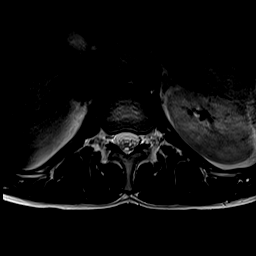
[im 43/43]
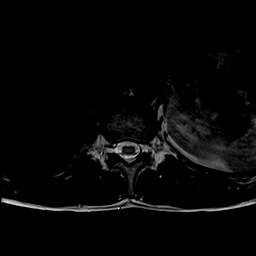

[Series 9: T1 · axial · 4.0mm · 0.39mm/px · z∈[-243,+9]mm · 9 of 43 slices shown (2 of 2)]
[im 1/43]
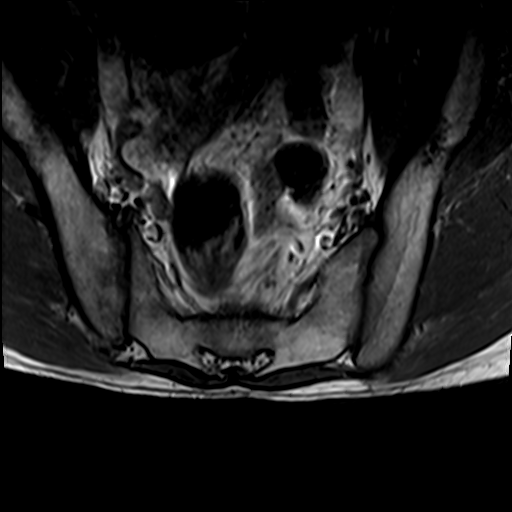
[im 7/43]
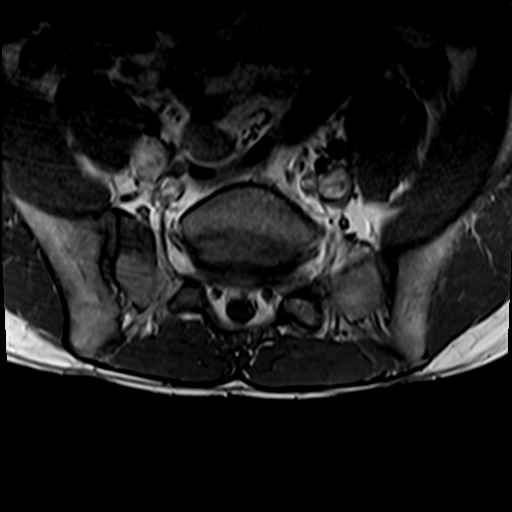
[im 13/43]
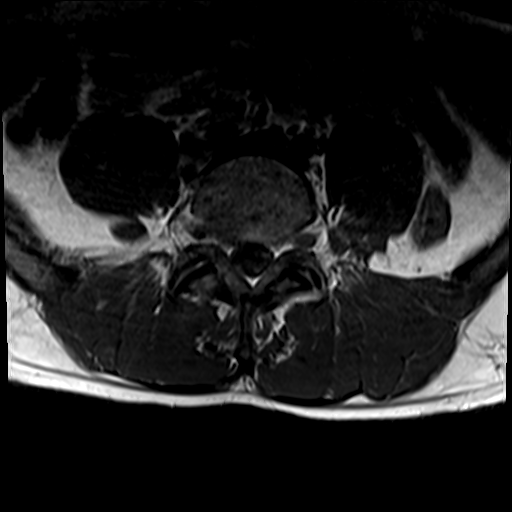
[im 19/43]
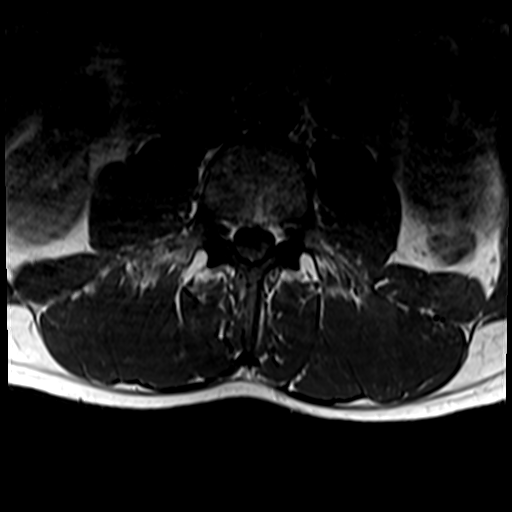
[im 22/43]
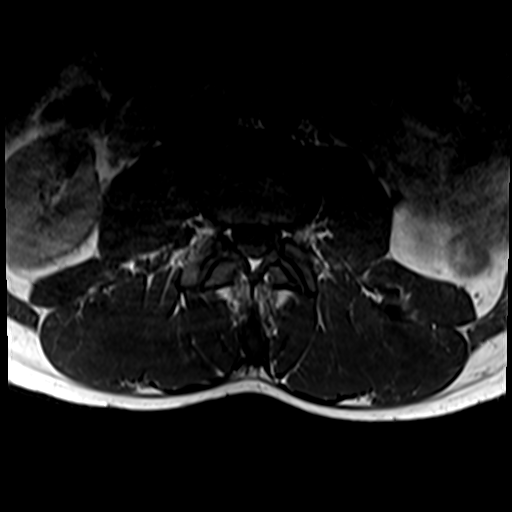
[im 25/43]
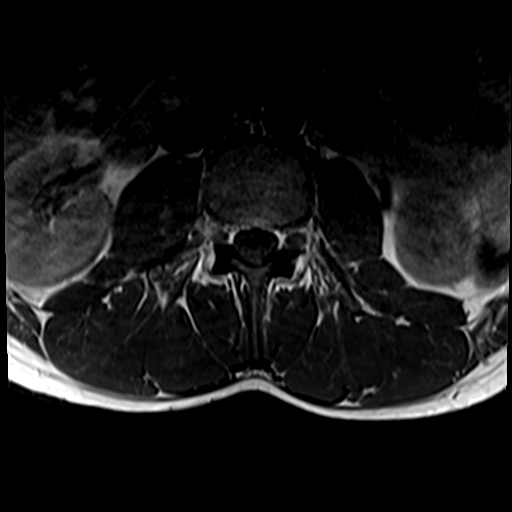
[im 31/43]
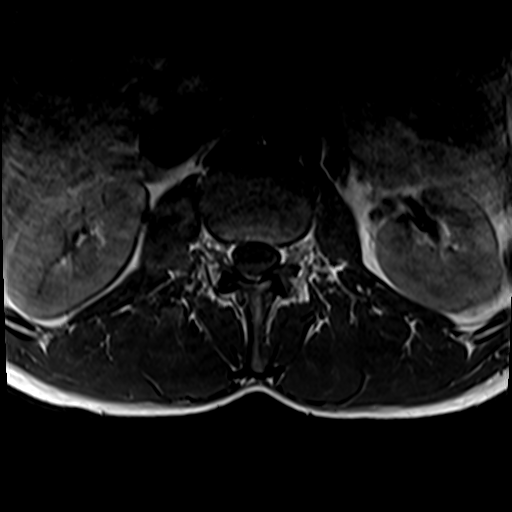
[im 37/43]
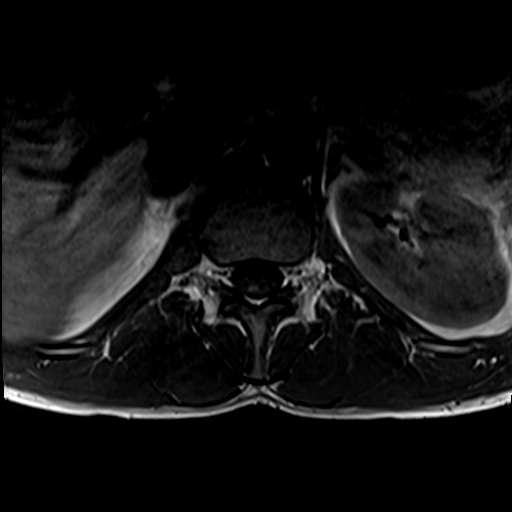
[im 43/43]
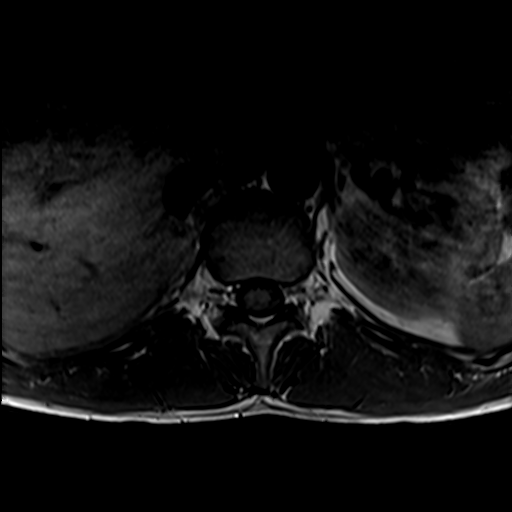

[32 of 48 positions shown; findings below may reference images not displayed]

FINDINGS: Segmentation: Transitional lumbosacral anatomy with partial
sacralization of L5 on the right.

Alignment:  Physiologic.

Vertebrae:  No fracture, evidence of discitis, or bone lesion.

Conus medullaris and cauda equina: Conus extends to the T12-L1
level. Conus and cauda equina appear normal.

Paraspinal and other soft tissues: Negative.

Disc levels:

T11-T12 to L3-L4: Negative.

L4-L5: Mild disc bulging. Mild left neuroforaminal stenosis. No
spinal canal or right neuroforaminal stenosis.

L5-S1:  Negative disc.  Mild left facet arthropathy.  No stenosis.
IMPRESSION: 1. No acute abnormality.
2. Mild degenerative disc disease at L4-L5 with mild left
neuroforaminal stenosis.

## 2024-04-11 ENCOUNTER — Ambulatory Visit (INDEPENDENT_AMBULATORY_CARE_PROVIDER_SITE_OTHER): Payer: Self-pay | Admitting: Podiatry

## 2024-04-11 DIAGNOSIS — Z91199 Patient's noncompliance with other medical treatment and regimen due to unspecified reason: Secondary | ICD-10-CM

## 2024-04-11 NOTE — Progress Notes (Signed)
 No show
